# Patient Record
Sex: Female | Born: 1965 | Hispanic: Yes | Marital: Married | Smoking: Never smoker
Health system: Southern US, Community
[De-identification: ages and names within clinical notes are randomized; demographics above are authoritative.]

## PROBLEM LIST (undated history)

## (undated) DIAGNOSIS — K219 Gastro-esophageal reflux disease without esophagitis: Secondary | ICD-10-CM

## (undated) DIAGNOSIS — K2970 Gastritis, unspecified, without bleeding: Secondary | ICD-10-CM

## (undated) DIAGNOSIS — J45909 Unspecified asthma, uncomplicated: Secondary | ICD-10-CM

## (undated) DIAGNOSIS — G43909 Migraine, unspecified, not intractable, without status migrainosus: Secondary | ICD-10-CM

## (undated) DIAGNOSIS — I10 Essential (primary) hypertension: Secondary | ICD-10-CM

---

## 2012-10-25 ENCOUNTER — Encounter (HOSPITAL_COMMUNITY): Payer: Self-pay | Admitting: Emergency Medicine

## 2012-10-25 ENCOUNTER — Emergency Department (HOSPITAL_COMMUNITY): Payer: Self-pay

## 2012-10-25 ENCOUNTER — Emergency Department (HOSPITAL_COMMUNITY)
Admission: EM | Admit: 2012-10-25 | Discharge: 2012-10-25 | Disposition: A | Discharge status: Home / Self Care | Payer: Self-pay | Attending: Emergency Medicine | Admitting: Emergency Medicine

## 2012-10-25 DIAGNOSIS — R6883 Chills (without fever): Secondary | ICD-10-CM | POA: Insufficient documentation

## 2012-10-25 DIAGNOSIS — R51 Headache: Secondary | ICD-10-CM | POA: Insufficient documentation

## 2012-10-25 DIAGNOSIS — IMO0001 Reserved for inherently not codable concepts without codable children: Secondary | ICD-10-CM | POA: Insufficient documentation

## 2012-10-25 DIAGNOSIS — J069 Acute upper respiratory infection, unspecified: Secondary | ICD-10-CM | POA: Insufficient documentation

## 2012-10-25 DIAGNOSIS — J45909 Unspecified asthma, uncomplicated: Secondary | ICD-10-CM

## 2012-10-25 DIAGNOSIS — Z8679 Personal history of other diseases of the circulatory system: Secondary | ICD-10-CM | POA: Insufficient documentation

## 2012-10-25 DIAGNOSIS — J45901 Unspecified asthma with (acute) exacerbation: Secondary | ICD-10-CM | POA: Insufficient documentation

## 2012-10-25 DIAGNOSIS — Z79899 Other long term (current) drug therapy: Secondary | ICD-10-CM | POA: Insufficient documentation

## 2012-10-25 DIAGNOSIS — J3489 Other specified disorders of nose and nasal sinuses: Secondary | ICD-10-CM | POA: Insufficient documentation

## 2012-10-25 DIAGNOSIS — J209 Acute bronchitis, unspecified: Secondary | ICD-10-CM

## 2012-10-25 DIAGNOSIS — R059 Cough, unspecified: Secondary | ICD-10-CM | POA: Insufficient documentation

## 2012-10-25 DIAGNOSIS — R05 Cough: Secondary | ICD-10-CM | POA: Insufficient documentation

## 2012-10-25 DIAGNOSIS — R079 Chest pain, unspecified: Secondary | ICD-10-CM | POA: Insufficient documentation

## 2012-10-25 DIAGNOSIS — G43909 Migraine, unspecified, not intractable, without status migrainosus: Secondary | ICD-10-CM | POA: Insufficient documentation

## 2012-10-25 DIAGNOSIS — I10 Essential (primary) hypertension: Secondary | ICD-10-CM | POA: Insufficient documentation

## 2012-10-25 DIAGNOSIS — R0602 Shortness of breath: Secondary | ICD-10-CM | POA: Insufficient documentation

## 2012-10-25 HISTORY — DX: Migraine, unspecified, not intractable, without status migrainosus: G43.909

## 2012-10-25 HISTORY — DX: Unspecified asthma, uncomplicated: J45.909

## 2012-10-25 HISTORY — DX: Essential (primary) hypertension: I10

## 2012-10-25 MED ORDER — PREDNISONE 20 MG PO TABS
40.0000 mg | ORAL_TABLET | Freq: Once | ORAL | Status: AC
  Administered 2012-10-25: 40 mg via ORAL
  Filled 2012-10-25: qty 2

## 2012-10-25 MED ORDER — PREDNISONE 20 MG PO TABS: 40.0000 mg | ORAL_TABLET | Freq: Every day | ORAL | Status: DC

## 2012-10-25 MED ORDER — IPRATROPIUM BROMIDE 0.02 % IN SOLN
0.5000 mg | Freq: Once | RESPIRATORY_TRACT | Status: AC
  Administered 2012-10-25: 0.5 mg via RESPIRATORY_TRACT
  Filled 2012-10-25: qty 2.5

## 2012-10-25 MED ORDER — HYDROCOD POLST-CHLORPHEN POLST 10-8 MG/5ML PO LQCR: 5.0000 mL | Freq: Two times a day (BID) | ORAL | Status: DC | PRN

## 2012-10-25 MED ORDER — ALBUTEROL SULFATE HFA 108 (90 BASE) MCG/ACT IN AERS
1.0000 | INHALATION_SPRAY | Freq: Four times a day (QID) | RESPIRATORY_TRACT | Status: DC | PRN
  Administered 2012-10-25: 1 via RESPIRATORY_TRACT
  Filled 2012-10-25: qty 6.7

## 2012-10-25 MED ORDER — HYDROCOD POLST-CHLORPHEN POLST 10-8 MG/5ML PO LQCR
5.0000 mL | Freq: Once | ORAL | Status: AC
  Administered 2012-10-25: 5 mL via ORAL
  Filled 2012-10-25: qty 5

## 2012-10-25 MED ORDER — ALBUTEROL SULFATE (5 MG/ML) 0.5% IN NEBU
5.0000 mg | INHALATION_SOLUTION | Freq: Once | RESPIRATORY_TRACT | Status: AC
  Administered 2012-10-25: 5 mg via RESPIRATORY_TRACT
  Filled 2012-10-25: qty 1

## 2012-10-25 NOTE — ED Notes (Signed)
Pt c/o URI sx with productive cough with green sputum and fever and body aches; pt sts pain with cough; pt sts hx of asthma and feels acting up

## 2012-10-25 NOTE — ED Provider Notes (Signed)
History     CSN: [id number]  Arrival date & time 10/25/12  1036   First MD Initiated Contact with Patient 10/25/12 1344      Chief Complaint  Patient presents with  . Asthma  . URI    (Consider location/radiation/quality/duration/timing/severity/associated sxs/prior treatment) HPI Comments: Patient speaks broken [place], however her son speaks fluently [place] and [place]. Patient came here to [place] approximately 5 months ago. She has a childhood history of asthma. She does have an inhaler that she uses very infrequently. She reports no prior hospitalizations or secondary to asthma. She reports that she's had a mild cough over the last 2 weeks but her symptoms of shortness of breath and coughing and now chest pain got much worse since yesterday. She reports the chest pain is worse when she coughs. It is apparently dry and nonproductive. She endorses some chills, unsure of fevers. She denies any nausea vomiting. Denies abdominal pain. She also reports some headaches and sinus pressure although she has no nasal congestion and can breathe through her nose well. She denies sore throat.  She has an inhaler again left over from when she traveled here. She does not have a local PMD. She has been taking some over-the-counter cold remedies without any significant relief.  Patient is a 47 y.o. female presenting with asthma and URI. The history is provided by the patient and a relative. The history is limited by a language barrier. No language interpreter was used.  Asthma Associated symptoms include chest pain, headaches and shortness of breath. Pertinent negatives include no abdominal pain.  URI Presenting symptoms: congestion and cough   Presenting symptoms: no rhinorrhea   Associated symptoms: headaches and myalgias     Past Medical History  Diagnosis Date  . Hypertension   . Asthma   . Migraine     History reviewed. No pertinent past surgical history.  History reviewed. No pertinent  family history.  History  Substance Use Topics  . Smoking status: Never Smoker   . Smokeless tobacco: Not on file  . Alcohol Use: No    OB History   Grav Para Term Preterm Abortions TAB SAB Ect Mult Living                  Review of Systems  Unable to perform ROS: Other  Constitutional: Positive for chills.  HENT: Positive for congestion and sinus pressure. Negative for rhinorrhea.   Respiratory: Positive for cough and shortness of breath.   Cardiovascular: Positive for chest pain.  Gastrointestinal: Negative for nausea, vomiting, abdominal pain and diarrhea.  Musculoskeletal: Positive for myalgias.  Neurological: Positive for headaches.    Allergies  Shellfish allergy  Home Medications   Current Outpatient Rx  Name  Route  Sig  Dispense  Refill  . albuterol (PROVENTIL HFA;VENTOLIN HFA) 108 (90 BASE) MCG/ACT inhaler   Inhalation   Inhale 2 puffs into the lungs every 6 (six) hours as needed for shortness of breath.         Marland Kitchen atenolol (TENORMIN) 25 MG tablet   Oral   Take 25 mg by mouth 2 (two) times daily.         . chlorpheniramine-HYDROcodone (TUSSIONEX PENNKINETIC ER) 10-8 MG/5ML LQCR   Oral   Take 5 mLs by mouth every 12 (twelve) hours as needed.   80 mL   0   . predniSONE (DELTASONE) 20 MG tablet   Oral   Take 2 tablets (40 mg total) by mouth daily.   12  tablet   0     BP 99/68  Pulse 93  Temp(Src) 99.2 F (37.3 C) (Oral)  Resp 13  SpO2 99%  Physical Exam  Nursing note and vitals reviewed. Constitutional: She appears well-developed and well-nourished. No distress.  HENT:  Head: Normocephalic and atraumatic.  Right Ear: External ear normal.  Left Ear: External ear normal.  Nose: No mucosal edema or rhinorrhea. Right sinus exhibits no maxillary sinus tenderness and no frontal sinus tenderness. Left sinus exhibits no maxillary sinus tenderness and no frontal sinus tenderness.  Mouth/Throat: Uvula is midline, oropharynx is clear and moist and  mucous membranes are normal.  Eyes: EOM are normal. Pupils are equal, round, and reactive to light. No scleral icterus.  Neck: Trachea normal and normal range of motion. Neck supple.  Cardiovascular: Normal rate and regular rhythm.   No murmur heard. Pulmonary/Chest: Effort normal. No stridor. She has no wheezes. She exhibits tenderness. She exhibits no crepitus, no edema and no deformity.  Paroxysmal coughing  Abdominal: Soft. She exhibits no distension. There is no tenderness.  Lymphadenopathy:    She has no cervical adenopathy.  Neurological: She is alert.  Skin: Skin is warm and dry. No rash noted. She is not diaphoretic.    ED Course  Procedures (including critical care time)  Labs Reviewed - No data to display Dg Chest 2 View  10/25/2012  *RADIOLOGY REPORT*  Clinical Data: Asthma, shortness of breath.  CHEST - 2 VIEW  Comparison: None  Findings: Mild peribronchial thickening. Heart and mediastinal contours are within normal limits.  Questionable opacity in the lingula suggesting early infiltrate.  No effusion.  No acute bony abnormality.  IMPRESSION:  Mild bronchitic changes.  Questionable early infiltrate in the lingula.   Original Report Authenticated By: Rolm Baptise, M.D.      1. Bronchitis with asthma, acute      ra sat is 96% and I interpret to be normal  EKG at time 13:54, shows sinus rhythm at a rate of 93. Normal axis, normal intervals, no ST or T-wave abnormalities. Interpretation of a normal EKG.  3:37 PM Pt still with some weezing, but breathing more comfortably, cough is improved.  Discussed bronchitis and need for continued treatments, steroids at home.     MDM  Pt with URI symptoms and associated bronchitis.  Would benefit I think from steroids, neb treatments and can prescribe tussionex for stronger cough suppression as well as help with CP which is reproducible and I think associated with coughing.          Saddie Benders. Ghim, MD 10/25/12 1540

## 2014-04-14 ENCOUNTER — Emergency Department (HOSPITAL_COMMUNITY)
Admission: EM | Admit: 2014-04-14 | Discharge: 2014-04-14 | Disposition: A | Discharge status: Home / Self Care | Payer: BC Managed Care – PPO | Attending: Emergency Medicine | Admitting: Emergency Medicine

## 2014-04-14 ENCOUNTER — Encounter (HOSPITAL_COMMUNITY): Payer: Self-pay | Admitting: Emergency Medicine

## 2014-04-14 ENCOUNTER — Emergency Department (HOSPITAL_COMMUNITY): Payer: BC Managed Care – PPO

## 2014-04-14 DIAGNOSIS — G43401 Hemiplegic migraine, not intractable, with status migrainosus: Secondary | ICD-10-CM | POA: Insufficient documentation

## 2014-04-14 DIAGNOSIS — IMO0002 Reserved for concepts with insufficient information to code with codable children: Secondary | ICD-10-CM | POA: Insufficient documentation

## 2014-04-14 DIAGNOSIS — J45909 Unspecified asthma, uncomplicated: Secondary | ICD-10-CM | POA: Diagnosis not present

## 2014-04-14 DIAGNOSIS — I10 Essential (primary) hypertension: Secondary | ICD-10-CM | POA: Insufficient documentation

## 2014-04-14 DIAGNOSIS — Z79899 Other long term (current) drug therapy: Secondary | ICD-10-CM | POA: Insufficient documentation

## 2014-04-14 DIAGNOSIS — R51 Headache: Secondary | ICD-10-CM | POA: Diagnosis present

## 2014-04-14 DIAGNOSIS — Z791 Long term (current) use of non-steroidal anti-inflammatories (NSAID): Secondary | ICD-10-CM | POA: Diagnosis not present

## 2014-04-14 LAB — DIFFERENTIAL
Basophils Absolute: 0 10*3/uL (ref 0.0–0.1)
Basophils Relative: 1 % (ref 0–1)
Eosinophils Absolute: 0.2 10*3/uL (ref 0.0–0.7)
Eosinophils Relative: 3 % (ref 0–5)
Lymphocytes Relative: 42 % (ref 12–46)
Lymphs Abs: 2.6 10*3/uL (ref 0.7–4.0)
Monocytes Absolute: 0.5 10*3/uL (ref 0.1–1.0)
Monocytes Relative: 8 % (ref 3–12)
Neutro Abs: 2.8 10*3/uL (ref 1.7–7.7)
Neutrophils Relative %: 46 % (ref 43–77)

## 2014-04-14 LAB — CBC
HCT: 38.1 % (ref 36.0–46.0)
Hemoglobin: 12.4 g/dL (ref 12.0–15.0)
MCH: 29.5 pg (ref 26.0–34.0)
MCHC: 32.5 g/dL (ref 30.0–36.0)
MCV: 90.7 fL (ref 78.0–100.0)
Platelets: 210 10*3/uL (ref 150–400)
RBC: 4.2 MIL/uL (ref 3.87–5.11)
RDW: 13.2 % (ref 11.5–15.5)
WBC: 6.2 10*3/uL (ref 4.0–10.5)

## 2014-04-14 LAB — COMPREHENSIVE METABOLIC PANEL
ALT: 22 U/L (ref 0–35)
AST: 23 U/L (ref 0–37)
Albumin: 3.5 g/dL (ref 3.5–5.2)
Alkaline Phosphatase: 40 U/L (ref 39–117)
Anion gap: 12 (ref 5–15)
BUN: 13 mg/dL (ref 6–23)
CO2: 27 mEq/L (ref 19–32)
Calcium: 10 mg/dL (ref 8.4–10.5)
Chloride: 99 mEq/L (ref 96–112)
Creatinine, Ser: 0.54 mg/dL (ref 0.50–1.10)
GFR calc Af Amer: >90 mL/min (ref >90)
GFR calc non Af Amer: >90 mL/min (ref >90)
Glucose, Bld: 98 mg/dL (ref 70–99)
Potassium: 4.6 mEq/L (ref 3.7–5.3)
Sodium: 138 mEq/L (ref 137–147)
Total Bilirubin: 0.2 mg/dL — ABNORMAL LOW (ref 0.3–1.2)
Total Protein: 7.4 g/dL (ref 6.0–8.3)

## 2014-04-14 LAB — PROTIME-INR
INR: 0.96 (ref 0.00–1.49)
Prothrombin Time: 12.8 seconds (ref 11.6–15.2)

## 2014-04-14 LAB — I-STAT TROPONIN, ED: Troponin i, poc: 0 ng/mL (ref 0.00–0.08)

## 2014-04-14 LAB — APTT: aPTT: 26 seconds (ref 24–37)

## 2014-04-14 MED ORDER — KETOROLAC TROMETHAMINE 30 MG/ML IJ SOLN
30.0000 mg | Freq: Once | INTRAMUSCULAR | Status: AC
  Administered 2014-04-14: 30 mg via INTRAVENOUS
  Filled 2014-04-14: qty 1

## 2014-04-14 MED ORDER — DIPHENHYDRAMINE HCL 50 MG/ML IJ SOLN
25.0000 mg | Freq: Once | INTRAMUSCULAR | Status: AC
  Administered 2014-04-14: 25 mg via INTRAVENOUS
  Filled 2014-04-14: qty 1

## 2014-04-14 MED ORDER — PROCHLORPERAZINE EDISYLATE 5 MG/ML IJ SOLN
10.0000 mg | Freq: Four times a day (QID) | INTRAMUSCULAR | Status: DC | PRN
  Administered 2014-04-14: 10 mg via INTRAVENOUS
  Filled 2014-04-14: qty 2

## 2014-04-14 NOTE — ED Notes (Signed)
CBG Resulted 89

## 2014-04-14 NOTE — ED Notes (Addendum)
Pt has returned from MRI, stating that she could not finish the exam. This RN spoke with MRI who states that the pt refused the suggestion to call for meds, and did not want to finish the exam, however, enough pictures were taken to r/o stroke.

## 2014-04-14 NOTE — ED Provider Notes (Signed)
CSN: [id number]     Arrival date & time 04/14/14  1740 History   First MD Initiated Contact with Patient 04/14/14 1816     Chief Complaint  Patient presents with  . Headache     (Consider location/radiation/quality/duration/timing/severity/associated sxs/prior Treatment) HPI Comments: Patient presents to ER for evaluation of left-sided facial and arm numbness and weakness. Patient complaining of concomitant headache. Patient had similar symptoms in the last couple of days. Symptoms have been coming and going. She complains of intermittent headache accompanied by left-sided numbness and tingling. Symptoms resolved and then returned. The current episode of left-sided weakness began exactly 2 hours ago.  Patient is a 48 y.o. female presenting with headaches.  Headache Associated symptoms: numbness     Past Medical History  Diagnosis Date  . Hypertension   . Asthma   . Migraine    History reviewed. No pertinent past surgical history. History reviewed. No pertinent family history. History  Substance Use Topics  . Smoking status: Never Smoker   . Smokeless tobacco: Not on file  . Alcohol Use: No   OB History   Grav Para Term Preterm Abortions TAB SAB Ect Mult Living                 Review of Systems  Neurological: Positive for weakness, numbness and headaches.  All other systems reviewed and are negative.     Allergies  Shellfish allergy  Home Medications   Prior to Admission medications   Medication Sig Start Date End Date Taking? Authorizing Provider  albuterol (PROVENTIL HFA;VENTOLIN HFA) 108 (90 BASE) MCG/ACT inhaler Inhale 2 puffs into the lungs every 6 (six) hours as needed for shortness of breath.   Yes Historical Provider, MD  atenolol (TENORMIN) 25 MG tablet Take 25 mg by mouth 2 (two) times daily.   Yes Historical Provider, MD  meloxicam (MOBIC) 15 MG tablet Take 15 mg by mouth daily.   Yes Historical Provider, MD  montelukast (SINGULAIR) 10 MG tablet Take 10  mg by mouth at bedtime.   Yes Historical Provider, MD  predniSONE (DELTASONE) 20 MG tablet Take 2 tablets (40 mg total) by mouth daily. 10/25/12  Yes Gavin Pound. Ghim, MD  SUMAtriptan (IMITREX) 100 MG tablet Take 100 mg by mouth every 2 (two) hours as needed for migraine or headache. May repeat in 2 hours if headache persists or recurs.   Yes Historical Provider, MD   BP 109/71  Pulse 80  Temp(Src) 97.7 F (36.5 C) (Oral)  Resp 17  Ht  (1.575 m)  Wt 196 lb (88.905 kg)  BMI 35.84 kg/m2  SpO2 100%  LMP 03/28/2014 Physical Exam  Constitutional: She is oriented to person, place, and time. She appears well-developed and well-nourished. No distress.  HENT:  Head: Normocephalic and atraumatic.  Right Ear: Hearing normal.  Left Ear: Hearing normal.  Nose: Nose normal.  Mouth/Throat: Oropharynx is clear and moist and mucous membranes are normal.  Eyes: Conjunctivae and EOM are normal. Pupils are equal, round, and reactive to light.  Neck: Normal range of motion. Neck supple.  Cardiovascular: Regular rhythm, S1 normal and S2 normal.  Exam reveals no gallop and no friction rub.   No murmur heard. Pulmonary/Chest: Effort normal and breath sounds normal. No respiratory distress. She exhibits no tenderness.  Abdominal: Soft. Normal appearance and bowel sounds are normal. There is no hepatosplenomegaly. There is no tenderness. There is no rebound, no guarding, no tenderness at McBurney's point and negative Murphy's sign. No hernia.  Musculoskeletal: Normal range of motion.  Neurological: She is alert and oriented to person, place, and time. She has normal strength. No cranial nerve deficit or sensory deficit. Coordination normal. GCS eye subscore is 4. GCS verbal subscore is 5. GCS motor subscore is 6.  Extraocular muscle movement: normal No visual field cut Pupils: equal and reactive both direct and consensual response is normal  Patient reports decreased sensation to light touch, left face  and left arm  Proprioception intact  Grip strength 5/5 symmetric in upper extremities Lower extremity strength 5/5 against gravity No pronator drift      Skin: Skin is warm, dry and intact. No rash noted. No cyanosis.  Psychiatric: She has a normal mood and affect. Her speech is normal and behavior is normal. Thought content normal.    ED Course  Procedures (including critical care time) Labs Review Labs Reviewed  COMPREHENSIVE METABOLIC PANEL - Abnormal; Notable for the following:    Total Bilirubin 0.2 (*)    All other components within normal limits  PROTIME-INR  APTT  CBC  DIFFERENTIAL  CBG MONITORING, ED  I-STAT TROPOININ, ED    Imaging Review Ct Head (brain) Wo Contrast  04/14/2014   CLINICAL DATA:  Headache.  Left-sided numbness.  EXAM: CT HEAD WITHOUT CONTRAST  TECHNIQUE: Contiguous axial images were obtained from the base of the skull through the vertex without intravenous contrast.  COMPARISON:  None.  FINDINGS: No acute intracranial abnormality. Specifically, no hemorrhage, hydrocephalus, mass lesion, acute infarction, or significant intracranial injury. No acute calvarial abnormality.  IMPRESSION: Negative noncontrast head CT.  Critical Value/emergent results were called by telephone at the time of interpretation on 04/14/2014 at 6:45 pm to Dr. Abner Greenspan , who verbally acknowledged these results.   Electronically Signed   By: Charlett Nose M.D.   On: 04/14/2014 18:46   Mr Brain Wo Contrast  04/14/2014   CLINICAL DATA:  Severe occipital headache with dizziness. History of migraines.  EXAM: MRI HEAD WITHOUT CONTRAST  TECHNIQUE: Multiplanar, multiecho pulse sequences of the brain and surrounding structures were obtained without intravenous contrast.  COMPARISON:  Prior CT from earlier the same day.  FINDINGS: Study is limited due to patient's inability to tolerate the entirety the exam.  The CSF containing spaces are within normal limits for patient age. No focal parenchymal  signal abnormality is identified. No mass lesion, midline shift, or extra-axial fluid collection. Ventricles are normal in size without evidence of hydrocephalus.  No diffusion-weighted signal abnormality is identified to suggest acute intracranial infarct. Gray-white matter differentiation is maintained. Normal flow voids are seen within the intracranial vasculature. No intracranial hemorrhage identified.  Cervicomedullary junction is within normal limits. Incidental note made of a partially empty sella. Pituitary stalk is midline. The globes and optic nerves demonstrate a normal appearance with normal signal intensity.  The bone marrow signal intensity is normal. Calvarium is intact. Visualized upper cervical spine is within normal limits.  Scalp soft tissues are unremarkable.  Small retention cyst noted within the right maxillary sinus. Paranasal sinuses are otherwise clear. Minimal T2 hyperintensity noted within the right mastoid air cells.  IMPRESSION: Limited study demonstrating no acute intracranial process.   Electronically Signed   By: Rise Mu M.D.   On: 04/14/2014 22:21     EKG Interpretation   Date/Time:  Friday April 14 2014 18:45:24 EDT Ventricular Rate:  69 PR Interval:  144 QRS Duration: 87 QT Interval:  432 QTC Calculation: 463 R Axis:   64 Text Interpretation:  Sinus rhythm Normal ECG Confirmed by Jeovanny Cuadros  MD,  Dashiell Franchino [phone]) on 04/14/2014 7:00:57 PM      MDM   Final diagnoses:  Hemiplegic migraine with status migrainosus, not intractable    Patient presented to the ER for evaluation of headache with a left facial numbness and left arm numbness tingling, possible weakness. She has had intermittent symptoms over the past several days. She does have a history of migraine headache, hemiplegic migraine was considered, but stroke cannot be ruled out. A code Stroke was initiated and she was evaluated by neurology. Neurology agreed that this was likely hemiplegic  migraine. Patient was administered Toradol, Compazine and Benadryl with resolution of symptoms. Plan was if the MRI did not show any acute stroke she can be discharged. MRI, although not the best study, did not show any abnormalities and she is back to her baseline, will be discharged to follow up as an outpatient with her neurologist.    Gilda Crease, MD 04/14/14 [phone]

## 2014-04-14 NOTE — Discharge Instructions (Signed)
Cefalea migrañosa °(Migraine Headache) °Una cefalea migrañosa es un dolor muy intenso y punzante en uno o ambos lados de la cabeza. Hable con su médico sobre los factores que pueden causar (desencadenar) las cefaleas migrañosas. °CUIDADOS EN EL HOGAR °· Tome solo los medicamentos según le haya indicado el médico. °· Cuando tenga la migraña, acuéstese en un cuarto oscuro y tranquilo °· Lleve un registro diario para averiguar si hay ciertas cosas que le provocan la cefalea migrañosa. Por ejemplo, escriba: °¨ Lo que usted come y bebe. °¨ Cuánto tiempo duerme. °¨ Algún cambio en su dieta o en los medicamentos. °· Beba menos alcohol. °· Si fuma, deje de hacerlo. °· Duerma lo suficiente. °· Disminuya todo tipo de estrés de la vida diaria. °· Mantenga las luces tenues si le molestan las luces brillantes o hacen que la migraña empeore. °SOLICITE AYUDA DE INMEDIATO SI:  °· La migraña empeora. °· Tiene fiebre. °· Presenta rigidez en el cuello. °· Tiene dificultad para ver. °· Sus músculos están débiles, o pierde el control muscular. °· Pierde el equilibrio o tiene problemas para caminar. °· Siente que se desvanece (debilidad) o se desmaya. °· Tiene malos síntomas que son diferentes a los primeros síntomas. °ASEGÚRESE DE QUE:  °· Comprende estas instrucciones. °· Controlará su afección. °· Recibirá ayuda de inmediato si no mejora o si empeora. °Document Released: 10/31/2008 Document Revised: 08/09/2013 °ExitCare® Patient Information ©2015 [employer]. This information is not intended to replace advice given to you by your health care provider. Make sure you discuss any questions you have with your health care provider. ° °

## 2014-04-14 NOTE — ED Notes (Signed)
Transport has arrived to take the pt to MRI, but the pt has not spoken with the doctor. This RN explained to the pt that the MRI today is to make sure that she did not have a stroke, and to make sure that her brain and the vessels in it are normal. Pt consented to MRI.

## 2014-04-14 NOTE — ED Notes (Signed)
Pt called out stating that she has an MRI scheduled in 2 weeks, and is wondering if she needs the MRI today. Pollina, MD is aware and will see the pt.

## 2014-04-14 NOTE — ED Notes (Signed)
Presetns with 2 days of headache that felt like a migraine-pain came and went-yesterday began having left sided arm numbness and headache that came and went as well-today at 4 pm began experiencing severe headache with nausea, dizziness and left sided arm numbness. Speech clear, no drift, arm grip equal bilaterally. Pt reports facial heaviness, left side slightly drooped, symmetrical with smile. Follows commands. Alert, oriented. Headache rated 10/10

## 2014-04-14 NOTE — ED Notes (Signed)
Activated Code Stroke 

## 2014-04-14 NOTE — Code Documentation (Signed)
Code stroke called at 1814, patient arrived to [facility] Ed via private vehicle.  LSN 04/12/14, As per patient has had a migraine since Wednesday and experiencing headache blurred vision and numbness of left side, symptoms never gone away completely just subside.  NIHSS 0.  Cancelled  1857

## 2014-04-14 NOTE — Consult Note (Signed)
Referring Physician: Pollina    Chief Complaint: Headache and left sided numbness  HPI: Loretta Carroll is an 48 y.o. female with a history of migraines that reports that on Wednesday she began to have headache that was associated with nausea and vomiting. She also developed left sided numbness and dizziness. Reports blurred vision and a heaviness on the left side of her face as well.  Her symptoms have waxed and waned since their initiation on Wednesday but have not completely resolved.  Reports that today her headache achieved a level of 10/10 and her accompanying symptoms worsened.  Patient presented for evaluation at that time.  Code stroke was called.  Initial NIHSS of 0   Date last known well: Date: 04/12/2014 Time last known well: Unable to determine tPA Given: No: Outside time window  Past Medical History  Diagnosis Date  . Hypertension   . Asthma   . Migraine     History reviewed. No pertinent past surgical history.  Family history: Son alive and well  Social History:  reports that she has never smoked. She does not have any smokeless tobacco history on file. She reports that she does not drink alcohol or use illicit drugs.  Allergies:  Allergies  Allergen Reactions  . Shellfish Allergy Anaphylaxis    Medications: I have reviewed the patient's current medications. Prior to Admission:  Current outpatient prescriptions:albuterol (PROVENTIL HFA;VENTOLIN HFA) 108 (90 BASE) MCG/ACT inhaler, Inhale 2 puffs into the lungs every 6 (six) hours as needed for shortness of breath., Disp: , Rfl: ;  atenolol (TENORMIN) 25 MG tablet, Take 25 mg by mouth 2 (two) times daily., Disp: , Rfl: ;  meloxicam (MOBIC) 15 MG tablet, Take 15 mg by mouth daily., Disp: , Rfl:  montelukast (SINGULAIR) 10 MG tablet, Take 10 mg by mouth at bedtime., Disp: , Rfl: ;  predniSONE (DELTASONE) 20 MG tablet, Take 2 tablets (40 mg total) by mouth daily., Disp: 12 tablet, Rfl: 0;  SUMAtriptan (IMITREX) 100 MG tablet,  Take 100 mg by mouth every 2 (two) hours as needed for migraine or headache. May repeat in 2 hours if headache persists or recurs., Disp: , Rfl:   ROS: History obtained from the patient  General ROS: negative for - chills, fatigue, fever, night sweats, weight gain or weight loss Psychological ROS: negative for - behavioral disorder, hallucinations, memory difficulties, mood swings or suicidal ideation Ophthalmic ROS: as noted in HPI ENT ROS: negative for - epistaxis, nasal discharge, oral lesions, sore throat, tinnitus or vertigo Allergy and Immunology ROS: negative for - hives or itchy/watery eyes Hematological and Lymphatic ROS: negative for - bleeding problems, bruising or swollen lymph nodes Endocrine ROS: negative for - galactorrhea, hair pattern changes, polydipsia/polyuria or temperature intolerance Respiratory ROS: negative for - cough, hemoptysis, shortness of breath or wheezing Cardiovascular ROS: negative for - chest pain, dyspnea on exertion, edema or irregular heartbeat Gastrointestinal ROS: as noted in HPI Genito-Urinary ROS: negative for - dysuria, hematuria, incontinence or urinary frequency/urgency Musculoskeletal ROS: negative for - joint swelling or muscular weakness Neurological ROS: as noted in HPI Dermatological ROS: negative for rash and skin lesion changes  Physical Examination: Blood pressure 105/67, pulse 59, temperature 97.9 F (36.6 C), temperature source Oral, resp. rate 12, height 5' 2"  (1.575 m), weight 88.905 kg (196 lb), last menstrual period 03/28/2014, SpO2 100.00%.  Neurologic Examination: Mental Status: Alert, oriented, thought content appropriate.  Speech fluent without evidence of aphasia.  Able to follow 3 step commands without difficulty. Cranial Nerves:  II: Discs flat bilaterally; Visual fields grossly normal, pupils equal, round, reactive to light and accommodation III,IV, VI: ptosis not present, extra-ocular motions intact bilaterally V,VII:  mild left facial asymmetry, facial light touch sensation normal bilaterally VIII: hearing normal bilaterally IX,X: gag reflex present XI: bilateral shoulder shrug XII: midline tongue extension Motor: Right : Upper extremity   5/5    Left:     Upper extremity   5/5  Lower extremity   5/5     Lower extremity   5/5 Tone and bulk:normal tone throughout; no atrophy noted Sensory: Pinprick and light touch intact throughout, bilaterally Deep Tendon Reflexes: 2+ and symmetric throughout Plantars: Right: downgoing   Left: downgoing Cerebellar: normal finger-to-nose and normal heel-to-shin test Gait: Unable to test due to pain CV: pulses palpable throughout     Laboratory Studies:  Basic Metabolic Panel:  Recent Labs Lab 04/14/14 1820  NA 138  K 4.6  CL 99  CO2 27  GLUCOSE 98  BUN 13  CREATININE 0.54  CALCIUM 10.0    Liver Function Tests:  Recent Labs Lab 04/14/14 1820  AST 23  ALT 22  ALKPHOS 40  BILITOT 0.2*  PROT 7.4  ALBUMIN 3.5   No results found for this basename: LIPASE, AMYLASE,  in the last 168 hours No results found for this basename: AMMONIA,  in the last 168 hours  CBC:  Recent Labs Lab 04/14/14 1820  WBC 6.2  NEUTROABS 2.8  HGB 12.4  HCT 38.1  MCV 90.7  PLT 210    Cardiac Enzymes: No results found for this basename: CKTOTAL, CKMB, CKMBINDEX, TROPONINI,  in the last 168 hours  BNP: No components found with this basename: POCBNP,   CBG: No results found for this basename: GLUCAP,  in the last 168 hours  Microbiology: No results found for this or any previous visit.  Coagulation Studies:  Recent Labs  04/14/14 1820  LABPROT 12.8  INR 0.96    Urinalysis: No results found for this basename: COLORURINE, APPERANCEUR, LABSPEC, PHURINE, GLUCOSEU, HGBUR, BILIRUBINUR, KETONESUR, PROTEINUR, UROBILINOGEN, NITRITE, LEUKOCYTESUR,  in the last 168 hours  Lipid Panel: No results found for this basename: chol, trig, hdl, cholhdl, vldl, ldlcalc     HgbA1C:  No results found for this basename: HGBA1C    Urine Drug Screen:   No results found for this basename: labopia, cocainscrnur, labbenz, amphetmu, thcu, labbarb    Alcohol Level: No results found for this basename: ETH,  in the last 168 hours  Other results: EKG: normal sinus rhythm at 69 bpm.  Imaging: Ct Head (brain) Wo Contrast  04/14/2014   CLINICAL DATA:  Headache.  Left-sided numbness.  EXAM: CT HEAD WITHOUT CONTRAST  TECHNIQUE: Contiguous axial images were obtained from the base of the skull through the vertex without intravenous contrast.  COMPARISON:  None.  FINDINGS: No acute intracranial abnormality. Specifically, no hemorrhage, hydrocephalus, mass lesion, acute infarction, or significant intracranial injury. No acute calvarial abnormality.  IMPRESSION: Negative noncontrast head CT.  Critical Value/emergent results were called by telephone at the time of interpretation on 04/14/2014 at 6:45 pm to Dr. Kirkland Hun , who verbally acknowledged these results.   Electronically Signed   By: Rolm Baptise M.D.   On: 04/14/2014 18:46    Assessment: 48 y.o. female presenting with headache and fluctuating left sided complaints of numbness with blurred vision.  Patient outside time window for tPA.  Head CT reviewed and shows no acute changes.  Symptoms likely represent a complicated migraine.  Due to persistence of symptoms though will rule out a subacute ischemic event.    Stroke Risk Factors - hypertension  Plan: 1. Migraine cocktail 2. MRI of the brain without contrast.  If no evidence of an acute event would not pursue further work up.    Case discussed with Dr. Charlann Noss, MD [facility] [phone] 04/14/2014, 7:17 PM

## 2014-04-17 LAB — CBG MONITORING, ED: GLUCOSE-CAPILLARY: 89 mg/dL (ref 70–99)

## 2016-01-06 ENCOUNTER — Emergency Department (HOSPITAL_COMMUNITY)
Admission: EM | Admit: 2016-01-06 | Discharge: 2016-01-06 | Disposition: A | Discharge status: Home / Self Care | Payer: BLUE CROSS/BLUE SHIELD | Attending: Emergency Medicine | Admitting: Emergency Medicine

## 2016-01-06 ENCOUNTER — Emergency Department (HOSPITAL_COMMUNITY): Payer: BLUE CROSS/BLUE SHIELD

## 2016-01-06 ENCOUNTER — Encounter (HOSPITAL_COMMUNITY): Payer: Self-pay

## 2016-01-06 DIAGNOSIS — Z791 Long term (current) use of non-steroidal anti-inflammatories (NSAID): Secondary | ICD-10-CM | POA: Insufficient documentation

## 2016-01-06 DIAGNOSIS — Z79899 Other long term (current) drug therapy: Secondary | ICD-10-CM | POA: Diagnosis not present

## 2016-01-06 DIAGNOSIS — I10 Essential (primary) hypertension: Secondary | ICD-10-CM | POA: Insufficient documentation

## 2016-01-06 DIAGNOSIS — R42 Dizziness and giddiness: Secondary | ICD-10-CM | POA: Diagnosis not present

## 2016-01-06 DIAGNOSIS — R1084 Generalized abdominal pain: Secondary | ICD-10-CM | POA: Insufficient documentation

## 2016-01-06 DIAGNOSIS — Z7952 Long term (current) use of systemic steroids: Secondary | ICD-10-CM | POA: Insufficient documentation

## 2016-01-06 DIAGNOSIS — R112 Nausea with vomiting, unspecified: Secondary | ICD-10-CM | POA: Diagnosis not present

## 2016-01-06 DIAGNOSIS — R200 Anesthesia of skin: Secondary | ICD-10-CM | POA: Diagnosis not present

## 2016-01-06 DIAGNOSIS — R51 Headache: Secondary | ICD-10-CM | POA: Insufficient documentation

## 2016-01-06 DIAGNOSIS — J45909 Unspecified asthma, uncomplicated: Secondary | ICD-10-CM | POA: Insufficient documentation

## 2016-01-06 DIAGNOSIS — R531 Weakness: Secondary | ICD-10-CM | POA: Diagnosis not present

## 2016-01-06 DIAGNOSIS — Z3202 Encounter for pregnancy test, result negative: Secondary | ICD-10-CM | POA: Diagnosis not present

## 2016-01-06 DIAGNOSIS — R1010 Upper abdominal pain, unspecified: Secondary | ICD-10-CM | POA: Diagnosis present

## 2016-01-06 LAB — COMPREHENSIVE METABOLIC PANEL
ALK PHOS: 45 U/L (ref 38–126)
ALT: 22 U/L (ref 14–54)
ANION GAP: 12 (ref 5–15)
AST: 25 U/L (ref 15–41)
Albumin: 3.5 g/dL (ref 3.5–5.0)
BILIRUBIN TOTAL: 0.3 mg/dL (ref 0.3–1.2)
BUN: 14 mg/dL (ref 6–20)
CALCIUM: 9.2 mg/dL (ref 8.9–10.3)
CO2: 20 mmol/L — ABNORMAL LOW (ref 22–32)
Chloride: 101 mmol/L (ref 101–111)
Creatinine, Ser: 0.57 mg/dL (ref 0.44–1.00)
GLUCOSE: 114 mg/dL — AB (ref 65–99)
POTASSIUM: 3.9 mmol/L (ref 3.5–5.1)
Sodium: 133 mmol/L — ABNORMAL LOW (ref 135–145)
TOTAL PROTEIN: 7.1 g/dL (ref 6.5–8.1)

## 2016-01-06 LAB — URINALYSIS, ROUTINE W REFLEX MICROSCOPIC
Glucose, UA: NEGATIVE mg/dL
Hgb urine dipstick: NEGATIVE
Ketones, ur: 15 mg/dL — AB
LEUKOCYTES UA: NEGATIVE
Nitrite: NEGATIVE
Protein, ur: NEGATIVE mg/dL
SPECIFIC GRAVITY, URINE: 1.036 — AB (ref 1.005–1.030)
pH: 5.5 (ref 5.0–8.0)

## 2016-01-06 LAB — CBC
HEMATOCRIT: 41.4 % (ref 36.0–46.0)
Hemoglobin: 13.2 g/dL (ref 12.0–15.0)
MCH: 27.7 pg (ref 26.0–34.0)
MCHC: 31.9 g/dL (ref 30.0–36.0)
MCV: 87 fL (ref 78.0–100.0)
Platelets: 172 10*3/uL (ref 150–400)
RBC: 4.76 MIL/uL (ref 3.87–5.11)
RDW: 13.1 % (ref 11.5–15.5)
WBC: 11.4 10*3/uL — ABNORMAL HIGH (ref 4.0–10.5)

## 2016-01-06 LAB — LIPASE, BLOOD: Lipase: 25 U/L (ref 11–51)

## 2016-01-06 LAB — HCG, QUANTITATIVE, PREGNANCY

## 2016-01-06 MED ORDER — DICYCLOMINE HCL 20 MG PO TABS: 20.0000 mg | ORAL_TABLET | Freq: Two times a day (BID) | ORAL | Status: DC

## 2016-01-06 MED ORDER — SODIUM CHLORIDE 0.9 % IV BOLUS (SEPSIS)
1000.0000 mL | Freq: Once | INTRAVENOUS | Status: AC
  Administered 2016-01-06: 1000 mL via INTRAVENOUS

## 2016-01-06 MED ORDER — GI COCKTAIL ~~LOC~~
30.0000 mL | Freq: Once | ORAL | Status: AC
  Administered 2016-01-06: 30 mL via ORAL
  Filled 2016-01-06: qty 30

## 2016-01-06 MED ORDER — ONDANSETRON HCL 4 MG/2ML IJ SOLN
4.0000 mg | Freq: Once | INTRAMUSCULAR | Status: AC
  Administered 2016-01-06: 4 mg via INTRAVENOUS
  Filled 2016-01-06: qty 2

## 2016-01-06 NOTE — Discharge Instructions (Signed)
As discussed, your evaluation today has been largely reassuring.  But, it is important that you monitor your condition carefully, and do not hesitate to return to the ED if you develop new, or concerning changes in your condition.  Otherwise, please follow-up with your physician for appropriate ongoing care.   Dolor abdominal en adultos (Abdominal Pain, Adult) El dolor puede tener muchas causas. Normalmente la causa del dolor abdominal no es una enfermedad y Teacher, English as a foreign language sin Clinical research associate. Frecuentemente puede controlarse y tratarse en casa. Su mdico le Chartered certified accountant examen fsico y posiblemente solicite anlisis de sangre y radiografas para ayudar a Teacher, adult education la gravedad de su dolor. Sin embargo, en [employer], debe transcurrir ms tiempo antes de que se pueda Pension scheme manager una causa evidente del dolor. Antes de llegar a ese punto, es posible que su mdico no sepa si necesita ms pruebas o un tratamiento ms profundo. INSTRUCCIONES PARA EL CUIDADO EN EL HOGAR  Est atento al dolor para ver si hay cambios. Las siguientes indicaciones ayudarn a Chief Strategy Officer que pueda sentir:  [place] solo medicamentos de venta libre o recetados, segn las indicaciones del mdico.  No tome laxantes a menos que se lo haya indicado su mdico.  Pruebe con Ardelia Mems dieta lquida absoluta (caldo, t o agua) segn se lo indique su mdico. Introduzca gradualmente una dieta normal, segn su tolerancia. SOLICITE ATENCIN MDICA SI:  Tiene dolor abdominal sin explicacin.  Tiene dolor abdominal relacionado con nuseas o diarrea.  Tiene dolor cuando orina o defeca.  Experimenta dolor abdominal que lo despierta de noche.  Tiene dolor abdominal que empeora o mejora cuando come alimentos.  Tiene dolor abdominal que empeora cuando come alimentos grasosos.  Tiene fiebre. SOLICITE ATENCIN MDICA DE INMEDIATO SI:   El dolor no desaparece en un plazo mximo de 2horas.  No deja de (vomitar).  El Social research officer, government se siente  solo en partes del abdomen, como el lado derecho o la parte inferior izquierda del abdomen.  Evaca materia fecal sanguinolenta o negra, de aspecto alquitranado. ASEGRESE DE QUE:  Comprende estas instrucciones.  Controlar su afeccin.  Recibir ayuda de inmediato si no mejora o si empeora.   Esta informacin no tiene Marine scientist el consejo del mdico. Asegrese de hacerle al mdico cualquier pregunta que tenga.   Document Released: 08/04/2005 Document Revised: 08/25/2014 Elsevier Interactive Patient Education [employer].

## 2016-01-06 NOTE — ED Notes (Signed)
Attempted to draw pts labs was unsuccessful. Asher MuirJamie, RN notified

## 2016-01-06 NOTE — ED Notes (Signed)
Patient here with acute dizziness that started at 0800 and feels as if her mouth is heavy. Slight mouth droop noted, no extremity weakness. Reports that these symptoms started after she developed left sided abdominal pain and cramping and had taken dulcolax last pm for constipation. Had 1 episode of vomiting

## 2016-01-06 NOTE — ED Notes (Signed)
[place] up to evaluate patient, no code stroke activated

## 2016-01-06 NOTE — ED Provider Notes (Signed)
CSN: [id number]     Arrival date & time 01/06/16  1219 History   First MD Initiated Contact with Patient 01/06/16 1259     Chief Complaint  Patient presents with  . Dizziness  . facial numbness   . Abdominal Pain    HPI  Patient was also concern of dizziness, nauseousness and upper abdominal discomfort. Symptoms began earlier today about 8 hours ago. Initially there was abdominal pain, subsequently patient developed her other symptoms. Patient has a history of migraines, ongoing gastroenterologic issues, for which she has seen physicians, had endoscopy, and is currently taking multiple medications. Patient has one recent sick contact. Symptoms symptoms began earlier today, she has had persistent mild nausea, dizziness, but no loss of sensation, strength, sight. She had a small amount of vomiting earlier today, no diarrhea. Patient has taken stool softener recently due to constipation.   Past Medical History  Diagnosis Date  . Hypertension   . Asthma   . Migraine    History reviewed. No pertinent past surgical history. No family history on file. Social History  Substance Use Topics  . Smoking status: Never Smoker   . Smokeless tobacco: None  . Alcohol Use: No   OB History    No data available     Review of Systems  Constitutional:       Per HPI, otherwise negative  HENT:       Per HPI, otherwise negative  Respiratory:       Per HPI, otherwise negative  Cardiovascular:       Per HPI, otherwise negative  Gastrointestinal: Positive for nausea and vomiting.  Endocrine:       Negative aside from HPI  Genitourinary:       Neg aside from HPI   Musculoskeletal:       Per HPI, otherwise negative  Skin: Negative.   Neurological: Positive for dizziness, weakness, numbness and headaches. Negative for syncope.      Allergies  Shellfish allergy  Home Medications   Prior to Admission medications   Medication Sig Start Date End Date Taking? Authorizing Provider   albuterol (PROVENTIL HFA;VENTOLIN HFA) 108 (90 BASE) MCG/ACT inhaler Inhale 2 puffs into the lungs every 6 (six) hours as needed for shortness of breath.    Historical Provider, MD  atenolol (TENORMIN) 25 MG tablet Take 25 mg by mouth 2 (two) times daily.    Historical Provider, MD  meloxicam (MOBIC) 15 MG tablet Take 15 mg by mouth daily.    Historical Provider, MD  montelukast (SINGULAIR) 10 MG tablet Take 10 mg by mouth at bedtime.    Historical Provider, MD  predniSONE (DELTASONE) 20 MG tablet Take 2 tablets (40 mg total) by mouth daily. 10/25/12   Kingsley Spittle, MD  SUMAtriptan (IMITREX) 100 MG tablet Take 100 mg by mouth every 2 (two) hours as needed for migraine or headache. May repeat in 2 hours if headache persists or recurs.    Historical Provider, MD   BP 134/96 mmHg  Pulse 83  Temp(Src) 98.1 F (36.7 C)  Resp 18  SpO2 100% Physical Exam  Constitutional: She is oriented to person, place, and time. She appears well-developed and well-nourished. No distress.  HENT:  Head: Normocephalic and atraumatic.  Eyes: Conjunctivae and EOM are normal.  Cardiovascular: Normal rate and regular rhythm.   Pulmonary/Chest: Effort normal and breath sounds normal. No stridor. No respiratory distress.  Abdominal: She exhibits no distension.  Mild tenderness about the epigastrium  Musculoskeletal: She exhibits no  edema.  Neurological: She is alert and oriented to person, place, and time. She displays no atrophy and no tremor. No cranial nerve deficit. She exhibits normal muscle tone. She displays no seizure activity. Coordination normal.  Skin: Skin is warm and dry.  Psychiatric: She has a normal mood and affect.  Nursing note and vitals reviewed.   ED Course  Procedures (including critical care time) Labs Review Labs Reviewed  COMPREHENSIVE METABOLIC PANEL - Abnormal; Notable for the following:    Sodium 133 (*)    CO2 20 (*)    Glucose, Bld 114 (*)    All other components within normal  limits  CBC - Abnormal; Notable for the following:    WBC 11.4 (*)    All other components within normal limits  URINALYSIS, ROUTINE W REFLEX MICROSCOPIC (NOT AT [facility]) - Abnormal; Notable for the following:    Color, Urine AMBER (*)    Specific Gravity, Urine 1.036 (*)    Bilirubin Urine SMALL (*)    Ketones, ur 15 (*)    All other components within normal limits  LIPASE, BLOOD  HCG, QUANTITATIVE, PREGNANCY  I-STAT BETA HCG BLOOD, ED ([facility], [facility], [facility] ONLY)    Imaging Review Dg Abd Acute W/chest  01/06/2016  CLINICAL DATA:  Epigastric pain. EXAM: DG ABDOMEN ACUTE W/ 1V CHEST COMPARISON:  October 25, 2012 FINDINGS: There is no evidence of dilated bowel loops or free intraperitoneal air. No radiopaque calculi or other significant radiographic abnormality is seen. Heart size and mediastinal contours are within normal limits. Both lungs are clear. IMPRESSION: Negative abdominal radiographs. No acute cardiopulmonary disease. Calcifications in the left pelvis are probably phleboliths. Electronically Signed   By: Dorise Bullion III M.D   On: 01/06/2016 15:16   I have personally reviewed and evaluated these images and lab results as part of my medical decision-making.   EKG Interpretation   Date/Time:  Sunday Jan 06 2016 12:27:51 EDT Ventricular Rate:  75 PR Interval:  140 QRS Duration: 74 QT Interval:  384 QTC Calculation: 428 R Axis:   62 Text Interpretation:  Normal sinus rhythm with sinus arrhythmia No  significant change since last tracing Borderline ECG Confirmed by  Carmin Muskrat  MD [phone]) on 01/06/2016 1:04:38 PM     Chart review notable for evaluation recently for numbness, patient was evaluated as a code stroke, with reassuring findings.  4:01 PM Patient states that she feels better. She has had one bowel movement, no diarrhea. Slight blood. Patient has a history of hemorrhoids. Patient states that she has GI with whom she'll follow-up in the coming days.  MDM  Patient  history of migraines now presents with abdominal pain. Patient does describe some nausea, dizziness, but there are no focal neurologic deficits, and with the patient's history of migraines, status migrainosus, there is some suspicion for atypical features of migraine contributing to her overall presentation. With no focal findings, no indication for head CT. Patient's evaluation otherwise reassuring, no evidence for acute new abdominal processes, and with improvement here with antiemetics, fluids, patient will follow-up as an outpatient with her gastroenterologist.  Carmin Muskrat, MD 01/06/16 [phone]

## 2016-01-06 NOTE — ED Notes (Signed)
Patient transported to X-ray 

## 2016-05-07 ENCOUNTER — Ambulatory Visit: Payer: Self-pay | Admitting: Gynecology

## 2016-05-22 ENCOUNTER — Ambulatory Visit: Payer: Self-pay | Admitting: Gynecology

## 2016-10-01 ENCOUNTER — Emergency Department (HOSPITAL_COMMUNITY): Payer: BLUE CROSS/BLUE SHIELD

## 2016-10-01 ENCOUNTER — Encounter (HOSPITAL_COMMUNITY): Payer: Self-pay

## 2016-10-01 ENCOUNTER — Emergency Department (HOSPITAL_COMMUNITY)
Admission: EM | Admit: 2016-10-01 | Discharge: 2016-10-02 | Disposition: A | Discharge status: Home / Self Care | Payer: BLUE CROSS/BLUE SHIELD | Attending: Emergency Medicine | Admitting: Emergency Medicine

## 2016-10-01 DIAGNOSIS — I10 Essential (primary) hypertension: Secondary | ICD-10-CM | POA: Insufficient documentation

## 2016-10-01 DIAGNOSIS — J45909 Unspecified asthma, uncomplicated: Secondary | ICD-10-CM | POA: Diagnosis not present

## 2016-10-01 DIAGNOSIS — Z79899 Other long term (current) drug therapy: Secondary | ICD-10-CM | POA: Insufficient documentation

## 2016-10-01 DIAGNOSIS — R109 Unspecified abdominal pain: Secondary | ICD-10-CM

## 2016-10-01 DIAGNOSIS — R1084 Generalized abdominal pain: Secondary | ICD-10-CM | POA: Diagnosis present

## 2016-10-01 LAB — COMPREHENSIVE METABOLIC PANEL
ALK PHOS: 43 U/L (ref 38–126)
ALT: 42 U/L (ref 14–54)
AST: 37 U/L (ref 15–41)
Albumin: 3.8 g/dL (ref 3.5–5.0)
Anion gap: 11 (ref 5–15)
BILIRUBIN TOTAL: 0.4 mg/dL (ref 0.3–1.2)
BUN: 11 mg/dL (ref 6–20)
CALCIUM: 9.1 mg/dL (ref 8.9–10.3)
CO2: 23 mmol/L (ref 22–32)
Chloride: 106 mmol/L (ref 101–111)
Creatinine, Ser: 0.62 mg/dL (ref 0.44–1.00)
GFR calc Af Amer: >60 mL/min (ref >60)
Glucose, Bld: 102 mg/dL — ABNORMAL HIGH (ref 65–99)
Potassium: 4.1 mmol/L (ref 3.5–5.1)
Sodium: 140 mmol/L (ref 135–145)
Total Protein: 7.4 g/dL (ref 6.5–8.1)

## 2016-10-01 LAB — URINALYSIS, ROUTINE W REFLEX MICROSCOPIC
BILIRUBIN URINE: NEGATIVE
Glucose, UA: NEGATIVE mg/dL
KETONES UR: NEGATIVE mg/dL
LEUKOCYTES UA: NEGATIVE
Nitrite: NEGATIVE
PH: 5.5 (ref 5.0–8.0)
Protein, ur: NEGATIVE mg/dL

## 2016-10-01 LAB — CBC
HCT: 38.1 % (ref 36.0–46.0)
Hemoglobin: 12.3 g/dL (ref 12.0–15.0)
MCH: 27.4 pg (ref 26.0–34.0)
MCHC: 32.3 g/dL (ref 30.0–36.0)
MCV: 84.9 fL (ref 78.0–100.0)
Platelets: 198 10*3/uL (ref 150–400)
RBC: 4.49 MIL/uL (ref 3.87–5.11)
RDW: 13.9 % (ref 11.5–15.5)
WBC: 4.6 10*3/uL (ref 4.0–10.5)

## 2016-10-01 LAB — URINALYSIS, MICROSCOPIC (REFLEX)

## 2016-10-01 LAB — LIPASE, BLOOD: Lipase: 26 U/L (ref 11–51)

## 2016-10-01 LAB — PREGNANCY, URINE: Preg Test, Ur: NEGATIVE

## 2016-10-01 MED ORDER — IOPAMIDOL (ISOVUE-300) INJECTION 61%
INTRAVENOUS | Status: AC
  Administered 2016-10-01: 100 mL
  Filled 2016-10-01: qty 100

## 2016-10-01 MED ORDER — METOCLOPRAMIDE HCL 5 MG/ML IJ SOLN
10.0000 mg | Freq: Once | INTRAMUSCULAR | Status: DC
  Filled 2016-10-01: qty 2

## 2016-10-01 NOTE — ED Provider Notes (Signed)
[facility]-EMERGENCY DEPT Provider Note   CSN: [id number] Arrival date & time: 10/01/16  2000     History   Chief Complaint Chief Complaint  Patient presents with  . Abdominal Pain    HPI Loretta Carroll is a 51 y.o. female.  HPI Presents with concerns of diffuse abdominal pain. Patient states the symptoms started approximately 1 week ago and she's currently being evaluated for possible IBS. She states she is very concerned that there is "something inside me" states that her abdomen is getting distended and that she is not having normal bowel movements. She is having at least 4 bowel movements of clear fluids daily. She feels like she cannot get a real bowel movement out. She also vomiting profusely. No recent sick contacts. Did have some rectal bleeding but has a hemorrhoid and this has subsided. There was never any black stool, no coffee-ground emesis or hematemesis. Patient states that her primary care provider thought she was constipated and put her on multiple laxatives. This is only worsened her diarrhea. Not on any antibiotics.  Past Medical History:  Diagnosis Date  . Asthma   . Hypertension   . Migraine     Patient Active Problem List   Diagnosis Date Noted  . Migraine     History reviewed. No pertinent surgical history.  OB History    No data available       Home Medications    Prior to Admission medications   Medication Sig Start Date End Date Taking? Authorizing Provider  albuterol (PROVENTIL HFA;VENTOLIN HFA) 108 (90 BASE) MCG/ACT inhaler Inhale 2 puffs into the lungs every 6 (six) hours as needed for shortness of breath.   Yes Historical Provider, MD  albuterol (PROVENTIL) (2.5 MG/3ML) 0.083% nebulizer solution Take 2.5 mg by nebulization every 6 (six) hours as needed for wheezing or shortness of breath.   Yes Historical Provider, MD  Bisacodyl (DULCOLAX PO) Take 2 tablets by mouth daily as needed (constiation).    Yes Historical Provider, MD  budesonide (PULMICORT)  180 MCG/ACT inhaler Inhale 1 puff into the lungs every morning.   Yes Historical Provider, MD  linaclotide (LINZESS) 145 MCG CAPS capsule Take 145 mcg by mouth daily before breakfast.   Yes Historical Provider, MD  montelukast (SINGULAIR) 10 MG tablet Take 10 mg by mouth at bedtime.   Yes Historical Provider, MD  omeprazole (PRILOSEC) 40 MG capsule Take 40 mg by mouth daily.   Yes Historical Provider, MD  ranitidine (ZANTAC) 150 MG tablet Take 150 mg by mouth 2 (two) times daily.   Yes Historical Provider, MD  simethicone (MYLICON) 80 MG chewable tablet Chew 80 mg by mouth every 6 (six) hours as needed for flatulence.   Yes Historical Provider, MD  SUMAtriptan (IMITREX) 100 MG tablet Take 100 mg by mouth every 2 (two) hours as needed for migraine or headache. May repeat in 2 hours if headache persists or recurs.   Yes Historical Provider, MD  metoCLOPramide (REGLAN) 10 MG tablet Take 1 tablet (10 mg total) by mouth every 8 (eight) hours as needed for nausea, vomiting or refractory nausea / vomiting. 10/02/16   Sidney AceAlison Charruf Taryn Shellhammer, MD    Family History History reviewed. No pertinent family history.  Social History Social History  Substance Use Topics  . Smoking status: Never Smoker  . Smokeless tobacco: Never Used  . Alcohol use No     Allergies   Shellfish allergy   Review of Systems Review of Systems  Constitutional: Negative for fever.  Allergic/Immunologic: Negative for immunocompromised state.  All other systems reviewed and are negative.    Physical Exam Updated Vital Signs BP 141/90   Pulse 108   Temp 98.6 F (37 C) (Oral)   Resp 20   Ht 5\' 2"  (1.575 m)   Wt 87.5 kg   SpO2 100%   BMI 35.30 kg/m   Physical Exam  Constitutional: She is oriented to person, place, and time. She appears well-developed and well-nourished. No distress.  HENT:  Head: Normocephalic and atraumatic.  Eyes: Conjunctivae are normal. Right eye exhibits no discharge. Left eye exhibits no  discharge.  Neck: Normal range of motion. Neck supple.  Cardiovascular: Normal rate and regular rhythm.   Pulmonary/Chest: Effort normal and breath sounds normal. No respiratory distress.  Abdominal: Soft. Bowel sounds are normal. She exhibits distension (indeterminate, obese). She exhibits no mass. There is no tenderness. There is no rebound and no guarding.  Neg cvat Neg murphys sign  Genitourinary:  Genitourinary Comments: Rectal wo stool sample; external skin tag/old hemorrhoid  Musculoskeletal: She exhibits no edema.  Neurological: She is alert and oriented to person, place, and time.  Skin: Skin is warm. No rash noted.  Psychiatric: She has a normal mood and affect.  Nursing note and vitals reviewed.    ED Treatments / Results  Labs (all labs ordered are listed, but only abnormal results are displayed) Labs Reviewed  COMPREHENSIVE METABOLIC PANEL - Abnormal; Notable for the following:       Result Value   Glucose, Bld 102 (*)    All other components within normal limits  URINALYSIS, ROUTINE W REFLEX MICROSCOPIC - Abnormal; Notable for the following:    Specific Gravity, Urine >1.030 (*)    Hgb urine dipstick TRACE (*)    All other components within normal limits  URINALYSIS, MICROSCOPIC (REFLEX) - Abnormal; Notable for the following:    Bacteria, UA RARE (*)    Squamous Epithelial / LPF 6-30 (*)    All other components within normal limits  LIPASE, BLOOD  CBC  PREGNANCY, URINE  POC URINE PREG, ED    EKG  EKG Interpretation None       Radiology Ct Abdomen Pelvis W Contrast  Result Date: 10/01/2016 CLINICAL DATA:  Acute onset of generalized abdominal pain, diarrhea and vomiting. Initial encounter. EXAM: CT ABDOMEN AND PELVIS WITH CONTRAST TECHNIQUE: Multidetector CT imaging of the abdomen and pelvis was performed using the standard protocol following bolus administration of intravenous contrast. CONTRAST:  ISOVUE-300 IOPAMIDOL (ISOVUE-300) INJECTION 61%  COMPARISON:  Abdominal radiographs performed 01/06/2016 FINDINGS: Lower chest: The visualized lung bases are grossly clear. The visualized portions of the mediastinum are unremarkable. Hepatobiliary: The liver is unremarkable in appearance. The patient appears to be status post cholecystectomy. The common bile duct remains normal in caliber. Pancreas: The pancreas is within normal limits. Spleen: The spleen is unremarkable in appearance. Adrenals/Urinary Tract: The adrenal glands are unremarkable in appearance. A small right renal cyst is noted. The kidneys are otherwise unremarkable. There is no evidence of hydronephrosis. No renal or ureteral stones are identified. No perinephric stranding is seen. Stomach/Bowel: The stomach is unremarkable in appearance. The small bowel is within normal limits. The appendix is normal in caliber, without evidence of appendicitis. The colon is unremarkable in appearance. Vascular/Lymphatic: The abdominal aorta is unremarkable in appearance. The inferior vena cava is grossly unremarkable. No retroperitoneal lymphadenopathy is seen. No pelvic sidewall lymphadenopathy is identified. Reproductive: The bladder is mildly distended and grossly unremarkable. Multiple uterine  fibroids are seen. The ovaries are relatively symmetric. No suspicious adnexal masses are seen. Other: No additional soft tissue abnormalities are seen. Musculoskeletal: No acute osseous abnormalities are identified. The visualized musculature is unremarkable in appearance. IMPRESSION: 1. No acute abnormality seen within the abdomen or pelvis. 2. Multiple uterine fibroids seen. 3. Small right renal cyst noted. Electronically Signed   By: Roanna Raider M.D.   On: 10/01/2016 23:53    Procedures Procedures (including critical care time)  Medications Ordered in ED Medications  metoCLOPramide (REGLAN) injection 10 mg (10 mg Intravenous Refused 10/01/16 2212)  iopamidol (ISOVUE-300) 61 % injection (100 mLs  Contrast  Given 10/01/16 2338)     Initial Impression / Assessment and Plan / ED Course  I have reviewed the triage vital signs and the nursing notes.  Pertinent labs & imaging results that were available during my care of the patient were reviewed by me and considered in my medical decision making (see chart for details).     Sx most consistent with taking too many laxatives with possible gastroparesis. Already on PPI. Feels distended and hx DM. Reglan given and pt did not vomit in ED, tolerating PO. CT A/P negative. Labs reassuring. Has fu with GI for endoscopy and colonoscopy. Advised to stop laxatives and told of CT's fibroids. Strict return precautions. DC in good condition. Doubt ACS - no CP/SOB.   Final Clinical Impressions(s) / ED Diagnoses   Final diagnoses:  Abdominal pain, unspecified abdominal location    New Prescriptions New Prescriptions   METOCLOPRAMIDE (REGLAN) 10 MG TABLET    Take 1 tablet (10 mg total) by mouth every 8 (eight) hours as needed for nausea, vomiting or refractory nausea / vomiting.     Sidney Ace, MD 10/02/16 0030    Cathren Laine, MD 10/07/16 [phone]

## 2016-10-01 NOTE — ED Triage Notes (Signed)
Pt complaining of abdominal pain. Pt states seen by PCP recently, given laxities. Pt states 2 episodes of diarrhea today. Pt states emesis x 3 days. Pt denies any emesis today. Pt denies any urinary symptoms.

## 2016-10-01 NOTE — ED Notes (Signed)
Dr. Franklyn Loruch at bedside.

## 2016-10-02 MED ORDER — METOCLOPRAMIDE HCL 10 MG PO TABS: 10.0000 mg | ORAL_TABLET | Freq: Three times a day (TID) | ORAL | 0 refills | Status: DC | PRN

## 2016-10-02 NOTE — ED Notes (Signed)
Pt stable, understands discharge instructions, and reasons for return.   

## 2016-10-02 NOTE — Discharge Instructions (Signed)
1. No acute abnormality seen within the abdomen or pelvis. 2. Multiple uterine fibroids seen. 3. Small right renal cyst noted.  Stop your laxatives

## 2016-12-03 ENCOUNTER — Ambulatory Visit: Payer: Self-pay | Admitting: Gynecology

## 2016-12-31 ENCOUNTER — Encounter: Payer: Self-pay | Admitting: Gynecology

## 2018-03-11 ENCOUNTER — Emergency Department (HOSPITAL_COMMUNITY): Payer: BLUE CROSS/BLUE SHIELD

## 2018-03-11 ENCOUNTER — Encounter (HOSPITAL_COMMUNITY): Payer: Self-pay

## 2018-03-11 ENCOUNTER — Emergency Department (HOSPITAL_COMMUNITY)
Admission: EM | Admit: 2018-03-11 | Discharge: 2018-03-11 | Disposition: A | Discharge status: Home / Self Care | Payer: BLUE CROSS/BLUE SHIELD | Attending: Emergency Medicine | Admitting: Emergency Medicine

## 2018-03-11 DIAGNOSIS — M62838 Other muscle spasm: Secondary | ICD-10-CM | POA: Diagnosis not present

## 2018-03-11 DIAGNOSIS — G4489 Other headache syndrome: Secondary | ICD-10-CM | POA: Insufficient documentation

## 2018-03-11 DIAGNOSIS — J45909 Unspecified asthma, uncomplicated: Secondary | ICD-10-CM | POA: Diagnosis not present

## 2018-03-11 DIAGNOSIS — Z79899 Other long term (current) drug therapy: Secondary | ICD-10-CM | POA: Insufficient documentation

## 2018-03-11 DIAGNOSIS — R51 Headache: Secondary | ICD-10-CM | POA: Diagnosis present

## 2018-03-11 DIAGNOSIS — I10 Essential (primary) hypertension: Secondary | ICD-10-CM | POA: Diagnosis not present

## 2018-03-11 LAB — BASIC METABOLIC PANEL
Anion gap: 9 (ref 5–15)
BUN: 9 mg/dL (ref 6–20)
CALCIUM: 9.5 mg/dL (ref 8.9–10.3)
CO2: 25 mmol/L (ref 22–32)
CREATININE: 0.57 mg/dL (ref 0.44–1.00)
Chloride: 104 mmol/L (ref 98–111)
Glucose, Bld: 120 mg/dL — ABNORMAL HIGH (ref 70–99)
Potassium: 4.5 mmol/L (ref 3.5–5.1)
SODIUM: 138 mmol/L (ref 135–145)

## 2018-03-11 LAB — CBC WITH DIFFERENTIAL/PLATELET
Abs Immature Granulocytes: 0 10*3/uL (ref 0.0–0.1)
BASOS ABS: 0 10*3/uL (ref 0.0–0.1)
BASOS PCT: 0 %
Eosinophils Absolute: 0.2 10*3/uL (ref 0.0–0.7)
Eosinophils Relative: 3 %
HCT: 36.9 % (ref 36.0–46.0)
Hemoglobin: 11.5 g/dL — ABNORMAL LOW (ref 12.0–15.0)
Immature Granulocytes: 0 %
LYMPHS PCT: 30 %
Lymphs Abs: 1.6 10*3/uL (ref 0.7–4.0)
MCH: 28.3 pg (ref 26.0–34.0)
MCHC: 31.2 g/dL (ref 30.0–36.0)
MCV: 90.7 fL (ref 78.0–100.0)
MONO ABS: 0.6 10*3/uL (ref 0.1–1.0)
Monocytes Relative: 12 %
Neutro Abs: 2.9 10*3/uL (ref 1.7–7.7)
Neutrophils Relative %: 55 %
PLATELETS: 201 10*3/uL (ref 150–400)
RBC: 4.07 MIL/uL (ref 3.87–5.11)
RDW: 13.7 % (ref 11.5–15.5)
WBC: 5.2 10*3/uL (ref 4.0–10.5)

## 2018-03-11 MED ORDER — PROMETHAZINE HCL 25 MG PO TABS: 25.0000 mg | ORAL_TABLET | Freq: Four times a day (QID) | ORAL | 0 refills | Status: DC | PRN

## 2018-03-11 MED ORDER — SODIUM CHLORIDE 0.9 % IV SOLN
INTRAVENOUS | Status: DC
  Administered 2018-03-11: 17:00:00 via INTRAVENOUS

## 2018-03-11 MED ORDER — DIAZEPAM 5 MG PO TABS
5.0000 mg | ORAL_TABLET | Freq: Once | ORAL | Status: AC
  Administered 2018-03-11: 5 mg via ORAL
  Filled 2018-03-11: qty 1

## 2018-03-11 MED ORDER — METOCLOPRAMIDE HCL 5 MG/ML IJ SOLN
10.0000 mg | Freq: Once | INTRAMUSCULAR | Status: AC
  Administered 2018-03-11: 10 mg via INTRAVENOUS
  Filled 2018-03-11: qty 2

## 2018-03-11 MED ORDER — DIPHENHYDRAMINE HCL 50 MG/ML IJ SOLN
25.0000 mg | Freq: Once | INTRAMUSCULAR | Status: AC
  Administered 2018-03-11: 25 mg via INTRAVENOUS
  Filled 2018-03-11: qty 1

## 2018-03-11 MED ORDER — METHOCARBAMOL 500 MG PO TABS: 500.0000 mg | ORAL_TABLET | Freq: Two times a day (BID) | ORAL | 0 refills | Status: DC

## 2018-03-11 MED ORDER — MAGNESIUM SULFATE 2 GM/50ML IV SOLN
2.0000 g | Freq: Once | INTRAVENOUS | Status: AC
  Administered 2018-03-11: 2 g via INTRAVENOUS
  Filled 2018-03-11: qty 50

## 2018-03-11 MED ORDER — DEXAMETHASONE SODIUM PHOSPHATE 10 MG/ML IJ SOLN
10.0000 mg | Freq: Once | INTRAMUSCULAR | Status: AC
  Administered 2018-03-11: 10 mg via INTRAVENOUS
  Filled 2018-03-11: qty 1

## 2018-03-11 NOTE — ED Provider Notes (Signed)
[facility] EMERG[facility] DEPARTMENT Provider Note   CSN: [id number] Arrival date & time: 03/11/18  1535     History   Chief Complaint Chief Complaint  Patient presents with  . Headache    HPI Loretta Carroll is a 52 y.o. female who presents to the ED with c/o headache. The headache started July 11 and has continued. Patient saw her PCP 4 days ago and then again 3 days ago. Patient reports being treated with Toradol injection that has not helped. Patient c/o nausea, photophobia like she does with regular headaches but reports this headache is worse.  HPI  Past Medical History:  Diagnosis Date  . Asthma   . Hypertension   . Migraine     Patient Active Problem List   Diagnosis Date Noted  . Migraine     History reviewed. No pertinent surgical history.   OB History   None      Home Medications    Prior to Admission medications   Medication Sig Start Date End Date Taking? Authorizing Provider  albuterol (PROVENTIL HFA;VENTOLIN HFA) 108 (90 BASE) MCG/ACT inhaler Inhale 2 puffs into the lungs every 6 (six) hours as needed for shortness of breath.    [provider]  albuterol (PROVENTIL) (2.5 MG/3ML) 0.083% nebulizer solution Take 2.5 mg by nebulization every 6 (six) hours as needed for wheezing or shortness of breath.    [provider]  budesonide (PULMICORT) 180 MCG/ACT inhaler Inhale 1 puff into the lungs every morning.    [provider]  methocarbamol (ROBAXIN) 500 MG tablet Take 1 tablet (500 mg total) by mouth 2 (two) times daily. 03/11/18   Ashley Murrain, NP  montelukast (SINGULAIR) 10 MG tablet Take 10 mg by mouth at bedtime.    [provider]  omeprazole (PRILOSEC) 40 MG capsule Take 40 mg by mouth daily.    [provider]  promethazine (PHENERGAN) 25 MG tablet Take 1 tablet (25 mg total) by mouth every 6 (six) hours as needed for nausea or vomiting. 03/11/18   Ashley Murrain, NP  ranitidine (ZANTAC) 150 MG  tablet Take 150 mg by mouth 2 (two) times daily.    [provider]  simethicone (MYLICON) 80 MG chewable tablet Chew 80 mg by mouth every 6 (six) hours as needed for flatulence.    [provider]  SUMAtriptan (IMITREX) 100 MG tablet Take 100 mg by mouth every 2 (two) hours as needed for migraine or headache. May repeat in 2 hours if headache persists or recurs.    [provider]    Family History History reviewed. No pertinent family history.  Social History Social History   Tobacco Use  . Smoking status: Never Smoker  . Smokeless tobacco: Never Used  Substance Use Topics  . Alcohol use: No  . Drug use: No     Allergies   Shellfish allergy   Review of Systems Review of Systems  Constitutional: Negative for chills and fever.  HENT: Negative.   Eyes: Positive for photophobia.  Respiratory: Negative for shortness of breath.   Cardiovascular: Negative for chest pain.  Gastrointestinal: Positive for nausea. Negative for abdominal pain.  Musculoskeletal: Negative for back pain.  Skin: Negative for rash.  Neurological: Positive for headaches. Negative for syncope.  Psychiatric/Behavioral: Negative for confusion.     Physical Exam Updated Vital Signs BP 116/75   Pulse 84   Temp 99.2 F (37.3 C) (Oral)   Resp 18   Ht 5'  1" (1.549 m)   Wt 91.6 kg (202 lb)   LMP 03/01/2018 (Approximate)   SpO2 96%   BMI 38.17 kg/m   Physical Exam  Constitutional: She is oriented to person, place, and time. She appears well-developed and well-nourished. No distress.  Eyes: Pupils are equal, round, and reactive to light. EOM are normal. Right eye exhibits no nystagmus. Left eye exhibits no nystagmus.  Neck: Normal range of motion. Neck supple.  Cardiovascular: Normal rate.  Pulmonary/Chest: Effort normal.  Abdominal: Soft. There is no tenderness.  Musculoskeletal: Normal range of motion.  Neurological: She is alert and oriented to person, place, and time.  She has normal strength. No cranial nerve deficit or sensory deficit. She displays a negative Romberg sign. Coordination and gait normal.  Reflex Scores:      Bicep reflexes are 2+ on the right side and 2+ on the left side.      Brachioradialis reflexes are 2+ on the right side and 2+ on the left side.      Patellar reflexes are 2+ on the right side and 2+ on the left side. Skin: Skin is warm and dry.  Psychiatric: She has a normal mood and affect. Her behavior is normal.  Nursing note and vitals reviewed.    ED Treatments / Results  Labs (all labs ordered are listed, but only abnormal results are displayed) Labs Reviewed  CBC WITH DIFFERENTIAL/PLATELET - Abnormal; Notable for the following components:      Result Value   Hemoglobin 11.5 (*)    All other components within normal limits  BASIC METABOLIC PANEL - Abnormal; Notable for the following components:   Glucose, Bld 120 (*)    All other components within normal limits   Radiology Ct Head Wo Contrast  Result Date: 03/11/2018 CLINICAL DATA:  Headache behind both ears for the past 2 weeks. EXAM: CT HEAD WITHOUT CONTRAST TECHNIQUE: Contiguous axial images were obtained from the base of the skull through the vertex without intravenous contrast. COMPARISON:  Brain MR dated 04/14/2014 and head CT dated 04/14/2014. FINDINGS: Brain: Normal appearing cerebral hemispheres and posterior fossa structures. Normal size and position of the ventricles. No intracranial hemorrhage, mass lesion or CT evidence of acute infarction. Vascular: No hyperdense vessel or unexpected calcification. Skull: Normal. Negative for fracture or focal lesion. Sinuses/Orbits: Right maxillary sinus retention cyst. Unremarkable orbits. Other: None. IMPRESSION: No acute abnormality. Electronically Signed   By: Claudie Revering M.D.   On: 03/11/2018 17:22    Procedures Procedures (including critical care time)  Medications Ordered in ED Medications  0.9 %  sodium chloride  infusion ( Intravenous Stopped 03/11/18 1828)  dexamethasone (DECADRON) injection 10 mg (10 mg Intravenous Given 03/11/18 1721)  diphenhydrAMINE (BENADRYL) injection 25 mg (25 mg Intravenous Given 03/11/18 1720)  metoCLOPramide (REGLAN) injection 10 mg (10 mg Intravenous Given 03/11/18 1721)  magnesium sulfate IVPB 2 g 50 mL (0 g Intravenous Stopped 03/11/18 2042)  diazepam (VALIUM) tablet 5 mg (5 mg Oral Given 03/11/18 1940)     Initial Impression / Assessment and Plan / ED Course  I have reviewed the triage vital signs and the nursing notes. SUBJECTIVE: Sophie Bloodgood is a 52 y.o. female who complains of headaches for one week Description of pain: squeezing pain, bilateral in the occipital area. Duration of individual headaches:  Associated symptoms: nausea and visual disturbance. Pain relief: unable to obtain relief with OTC meds, OTC NSAID's and prescription medications - NSAID's and Toradol injections PRN. Precipitating factors: patient is aware  of none. She denies a history of recent head injury.  Prior neurological history: negative for no neurological problems.  OBJECTIVE: Appearance: alert, well appearing, and in no distress. Neurological Exam: alert, oriented, normal speech, no focal findings or movement disorder noted.  ASSESSMENT: mixed tension and migraine headache.  PLAN: Recommendations: lie in darkened room and apply cold packs prn for pain and patient reassured that CT scan is normal and normal neuro exam. See orders for this visit as documented in the electronic medical record. Encouraged patient to discuss with her PCP neurology follow up. Patient's pain resolved after treatment in the ED.  Final Clinical Impressions(s) / ED Diagnoses   Final diagnoses:  Other headache syndrome  Muscle spasm    ED Discharge Orders        Ordered    methocarbamol (ROBAXIN) 500 MG tablet  2 times daily     03/11/18 2047    promethazine (PHENERGAN) 25 MG tablet  Every 6 hours PRN      03/11/18 2047       Debroah Baller Boley, NP 03/11/18 2144    Duffy Bruce, MD 03/11/18 [phone]

## 2018-03-11 NOTE — ED Triage Notes (Signed)
Pt presents with migraine headache since 7/11.  Pt was seen at PCP on Monday and Tuesday, received toradol injection which has not helped.  Pt reports typical headaches are generalized.  Pt reports normally, she has nausea and photophobia but since yesterday, she has several swollen areas to head that are very painful to palpation with pain behind them.

## 2018-03-11 NOTE — Discharge Instructions (Addendum)
Follow up with your doctor to discuss your headaches and possible referral to a neurologist. Do not take the muscle relaxer or the medication for nausea if you are driving because they can make you sleepy.

## 2019-04-27 ENCOUNTER — Emergency Department (HOSPITAL_COMMUNITY)
Admission: EM | Admit: 2019-04-27 | Discharge: 2019-04-28 | Discharge status: Against Medical Advice | Payer: BLUE CROSS/BLUE SHIELD | Attending: Emergency Medicine | Admitting: Emergency Medicine

## 2019-04-27 ENCOUNTER — Other Ambulatory Visit: Payer: Self-pay

## 2019-04-27 ENCOUNTER — Encounter (HOSPITAL_COMMUNITY): Payer: Self-pay | Admitting: Emergency Medicine

## 2019-04-27 DIAGNOSIS — Z5321 Procedure and treatment not carried out due to patient leaving prior to being seen by health care provider: Secondary | ICD-10-CM | POA: Insufficient documentation

## 2019-04-27 DIAGNOSIS — M549 Dorsalgia, unspecified: Secondary | ICD-10-CM | POA: Diagnosis present

## 2019-04-27 LAB — BASIC METABOLIC PANEL
Anion gap: 8 (ref 5–15)
BUN: 12 mg/dL (ref 6–20)
CO2: 25 mmol/L (ref 22–32)
Calcium: 9.2 mg/dL (ref 8.9–10.3)
Chloride: 104 mmol/L (ref 98–111)
Creatinine, Ser: 0.4 mg/dL — ABNORMAL LOW (ref 0.44–1.00)
GFR calc Af Amer: >60 mL/min (ref >60)
GFR calc non Af Amer: >60 mL/min (ref >60)
Glucose, Bld: 95 mg/dL (ref 70–99)
Potassium: 3.9 mmol/L (ref 3.5–5.1)
Sodium: 137 mmol/L (ref 135–145)

## 2019-04-27 LAB — CBC WITH DIFFERENTIAL/PLATELET
Abs Immature Granulocytes: 0.01 10*3/uL (ref 0.00–0.07)
Basophils Absolute: 0 10*3/uL (ref 0.0–0.1)
Basophils Relative: 1 %
Eosinophils Absolute: 0.2 10*3/uL (ref 0.0–0.5)
Eosinophils Relative: 3 %
HCT: 36.9 % (ref 36.0–46.0)
Hemoglobin: 11.6 g/dL — ABNORMAL LOW (ref 12.0–15.0)
Immature Granulocytes: 0 %
Lymphocytes Relative: 40 %
Lymphs Abs: 2.3 10*3/uL (ref 0.7–4.0)
MCH: 28.7 pg (ref 26.0–34.0)
MCHC: 31.4 g/dL (ref 30.0–36.0)
MCV: 91.3 fL (ref 80.0–100.0)
Monocytes Absolute: 0.6 10*3/uL (ref 0.1–1.0)
Monocytes Relative: 10 %
Neutro Abs: 2.6 10*3/uL (ref 1.7–7.7)
Neutrophils Relative %: 46 %
Platelets: 185 10*3/uL (ref 150–400)
RBC: 4.04 MIL/uL (ref 3.87–5.11)
RDW: 13.2 % (ref 11.5–15.5)
WBC: 5.7 10*3/uL (ref 4.0–10.5)
nRBC: 0 % (ref 0.0–0.2)

## 2019-04-27 LAB — URINALYSIS, ROUTINE W REFLEX MICROSCOPIC
Bilirubin Urine: NEGATIVE
Glucose, UA: NEGATIVE mg/dL
Ketones, ur: NEGATIVE mg/dL
Leukocytes,Ua: NEGATIVE
Nitrite: NEGATIVE
Protein, ur: NEGATIVE mg/dL
Specific Gravity, Urine: 1.012 (ref 1.005–1.030)
pH: 6 (ref 5.0–8.0)

## 2019-04-27 LAB — I-STAT BETA HCG BLOOD, ED (MC, WL, AP ONLY): I-stat hCG, quantitative: <5 m[IU]/mL (ref <5)

## 2019-04-27 NOTE — ED Triage Notes (Signed)
Patient reports upper right flank pain onset last week , denies injury , no urinary discomfort or hematuria . Denies fever or chills.

## 2019-04-28 NOTE — ED Notes (Signed)
Called for pt x3. No answer.  

## 2019-04-28 NOTE — ED Notes (Signed)
Pt left ED without being seen

## 2019-08-17 ENCOUNTER — Ambulatory Visit: Payer: BLUE CROSS/BLUE SHIELD | Attending: Internal Medicine

## 2019-08-17 DIAGNOSIS — Z20822 Contact with and (suspected) exposure to covid-19: Secondary | ICD-10-CM

## 2019-08-18 LAB — NOVEL CORONAVIRUS, NAA: SARS-CoV-2, NAA: NOT DETECTED

## 2020-01-14 ENCOUNTER — Other Ambulatory Visit: Payer: Self-pay

## 2020-01-14 ENCOUNTER — Inpatient Hospital Stay (HOSPITAL_BASED_OUTPATIENT_CLINIC_OR_DEPARTMENT_OTHER)
Admission: EM | Admit: 2020-01-14 | Discharge: 2020-01-16 | DRG: 372 | Disposition: A | Discharge status: Home / Self Care | Payer: BLUE CROSS/BLUE SHIELD | Attending: Internal Medicine | Admitting: Internal Medicine

## 2020-01-14 ENCOUNTER — Emergency Department (HOSPITAL_BASED_OUTPATIENT_CLINIC_OR_DEPARTMENT_OTHER): Payer: BLUE CROSS/BLUE SHIELD

## 2020-01-14 ENCOUNTER — Encounter (HOSPITAL_BASED_OUTPATIENT_CLINIC_OR_DEPARTMENT_OTHER): Payer: Self-pay | Admitting: Emergency Medicine

## 2020-01-14 DIAGNOSIS — J4520 Mild intermittent asthma, uncomplicated: Secondary | ICD-10-CM | POA: Diagnosis present

## 2020-01-14 DIAGNOSIS — A09 Infectious gastroenteritis and colitis, unspecified: Secondary | ICD-10-CM | POA: Diagnosis present

## 2020-01-14 DIAGNOSIS — A045 Campylobacter enteritis: Secondary | ICD-10-CM | POA: Diagnosis present

## 2020-01-14 DIAGNOSIS — E876 Hypokalemia: Secondary | ICD-10-CM | POA: Diagnosis present

## 2020-01-14 DIAGNOSIS — Z91013 Allergy to seafood: Secondary | ICD-10-CM | POA: Diagnosis not present

## 2020-01-14 DIAGNOSIS — G43909 Migraine, unspecified, not intractable, without status migrainosus: Secondary | ICD-10-CM | POA: Diagnosis present

## 2020-01-14 DIAGNOSIS — K219 Gastro-esophageal reflux disease without esophagitis: Secondary | ICD-10-CM | POA: Diagnosis present

## 2020-01-14 DIAGNOSIS — K5100 Ulcerative (chronic) pancolitis without complications: Secondary | ICD-10-CM | POA: Diagnosis present

## 2020-01-14 DIAGNOSIS — I10 Essential (primary) hypertension: Secondary | ICD-10-CM | POA: Diagnosis present

## 2020-01-14 DIAGNOSIS — Z7951 Long term (current) use of inhaled steroids: Secondary | ICD-10-CM | POA: Diagnosis not present

## 2020-01-14 DIAGNOSIS — Z20822 Contact with and (suspected) exposure to covid-19: Secondary | ICD-10-CM | POA: Diagnosis present

## 2020-01-14 DIAGNOSIS — R933 Abnormal findings on diagnostic imaging of other parts of digestive tract: Secondary | ICD-10-CM | POA: Diagnosis not present

## 2020-01-14 DIAGNOSIS — E861 Hypovolemia: Secondary | ICD-10-CM | POA: Diagnosis present

## 2020-01-14 DIAGNOSIS — R1030 Lower abdominal pain, unspecified: Secondary | ICD-10-CM | POA: Diagnosis not present

## 2020-01-14 LAB — URINALYSIS, ROUTINE W REFLEX MICROSCOPIC
Bilirubin Urine: NEGATIVE
Glucose, UA: NEGATIVE mg/dL
Ketones, ur: NEGATIVE mg/dL
Leukocytes,Ua: NEGATIVE
Nitrite: NEGATIVE
Protein, ur: NEGATIVE mg/dL
Specific Gravity, Urine: 1.025 (ref 1.005–1.030)
pH: 6 (ref 5.0–8.0)

## 2020-01-14 LAB — CBC WITH DIFFERENTIAL/PLATELET
Abs Immature Granulocytes: 0.04 10*3/uL (ref 0.00–0.07)
Basophils Absolute: 0 10*3/uL (ref 0.0–0.1)
Basophils Relative: 1 %
Eosinophils Absolute: 0.1 10*3/uL (ref 0.0–0.5)
Eosinophils Relative: 2 %
HCT: 34.8 % — ABNORMAL LOW (ref 36.0–46.0)
Hemoglobin: 11.3 g/dL — ABNORMAL LOW (ref 12.0–15.0)
Immature Granulocytes: 1 %
Lymphocytes Relative: 34 %
Lymphs Abs: 1.5 10*3/uL (ref 0.7–4.0)
MCH: 30 pg (ref 26.0–34.0)
MCHC: 32.5 g/dL (ref 30.0–36.0)
MCV: 92.3 fL (ref 80.0–100.0)
Monocytes Absolute: 1.1 10*3/uL — ABNORMAL HIGH (ref 0.1–1.0)
Monocytes Relative: 27 %
Neutro Abs: 1.5 10*3/uL — ABNORMAL LOW (ref 1.7–7.7)
Neutrophils Relative %: 35 %
Platelets: 230 10*3/uL (ref 150–400)
RBC: 3.77 MIL/uL — ABNORMAL LOW (ref 3.87–5.11)
RDW: 13.7 % (ref 11.5–15.5)
Smear Review: NORMAL
WBC: 4.3 10*3/uL (ref 4.0–10.5)
nRBC: 0 % (ref 0.0–0.2)

## 2020-01-14 LAB — COMPREHENSIVE METABOLIC PANEL
ALT: 16 U/L (ref 0–44)
AST: 18 U/L (ref 15–41)
Albumin: 3 g/dL — ABNORMAL LOW (ref 3.5–5.0)
Alkaline Phosphatase: 38 U/L (ref 38–126)
Anion gap: 8 (ref 5–15)
BUN: 6 mg/dL (ref 6–20)
CO2: 26 mmol/L (ref 22–32)
Calcium: 7.5 mg/dL — ABNORMAL LOW (ref 8.9–10.3)
Chloride: 103 mmol/L (ref 98–111)
Creatinine, Ser: 0.51 mg/dL (ref 0.44–1.00)
GFR calc Af Amer: >60 mL/min (ref >60)
GFR calc non Af Amer: >60 mL/min (ref >60)
Glucose, Bld: 96 mg/dL (ref 70–99)
Potassium: 3.2 mmol/L — ABNORMAL LOW (ref 3.5–5.1)
Sodium: 137 mmol/L (ref 135–145)
Total Bilirubin: 0.2 mg/dL — ABNORMAL LOW (ref 0.3–1.2)
Total Protein: 6.8 g/dL (ref 6.5–8.1)

## 2020-01-14 LAB — LACTIC ACID, PLASMA: Lactic Acid, Venous: 1 mmol/L (ref 0.5–1.9)

## 2020-01-14 LAB — URINALYSIS, MICROSCOPIC (REFLEX)

## 2020-01-14 LAB — OCCULT BLOOD X 1 CARD TO LAB, STOOL: Fecal Occult Bld: POSITIVE — AB

## 2020-01-14 LAB — LIPASE, BLOOD: Lipase: 25 U/L (ref 11–51)

## 2020-01-14 LAB — PREGNANCY, URINE: Preg Test, Ur: NEGATIVE

## 2020-01-14 MED ORDER — POTASSIUM CHLORIDE CRYS ER 20 MEQ PO TBCR
40.0000 meq | EXTENDED_RELEASE_TABLET | Freq: Once | ORAL | Status: AC
  Administered 2020-01-14: 40 meq via ORAL
  Filled 2020-01-14: qty 2

## 2020-01-14 MED ORDER — MORPHINE SULFATE (PF) 4 MG/ML IV SOLN
4.0000 mg | Freq: Once | INTRAVENOUS | Status: DC
  Filled 2020-01-14: qty 1

## 2020-01-14 MED ORDER — SODIUM CHLORIDE 0.9 % IV BOLUS
1000.0000 mL | Freq: Once | INTRAVENOUS | Status: AC
  Administered 2020-01-14: 1000 mL via INTRAVENOUS

## 2020-01-14 MED ORDER — IOHEXOL 300 MG/ML  SOLN
100.0000 mL | Freq: Once | INTRAMUSCULAR | Status: AC | PRN
  Administered 2020-01-14: 100 mL via INTRAVENOUS

## 2020-01-14 MED ORDER — SODIUM CHLORIDE 0.9 % IV SOLN: Freq: Once | INTRAVENOUS | Status: AC

## 2020-01-14 MED ORDER — ACETAMINOPHEN 500 MG PO TABS
1000.0000 mg | ORAL_TABLET | Freq: Once | ORAL | Status: AC
  Administered 2020-01-14: 1000 mg via ORAL
  Filled 2020-01-14: qty 2

## 2020-01-14 MED ORDER — SODIUM CHLORIDE FLUSH 0.9 % IV SOLN
5.00 | INTRAVENOUS | Status: DC
Start: ? — End: 2020-01-14

## 2020-01-14 MED ORDER — ONDANSETRON HCL 4 MG/2ML IJ SOLN
4.0000 mg | Freq: Once | INTRAMUSCULAR | Status: DC
  Filled 2020-01-14: qty 2

## 2020-01-14 NOTE — ED Triage Notes (Signed)
Pt c/o lower abdominal pain, diarrhea and rectal bleeding x 5 days. Pt  reports fever over last 5 days. Recently treated for HA and fever in ED, tested negative for Covid.

## 2020-01-14 NOTE — Progress Notes (Signed)
Patient presented to [facility] with several days of diarrhea (19 episodes thus far today) and also bloody diarrhea. Was at [facility] yesterday and received IVF and sent home. CT Abd/Pelv shows pancolitis. C Diff panel pending. Patient was on recent abx. Will accept admit for transfer to Med-Surg bed.   Barrington Ellison, MD [facility]

## 2020-01-14 NOTE — ED Notes (Signed)
[facility] notified [facility]) - patient ready for transport

## 2020-01-14 NOTE — ED Provider Notes (Signed)
[facility]PART[facility] Provider Note   CSN: [id number] Arrival date & time: 01/14/20  1845     History Chief Complaint  Patient presents with  . Abdominal Pain    Loretta Carroll is a 54 y.o. female.  Loretta Carroll is a 54 y.o. female with a history of hypertension, asthma and migraines, who presents to the emergency department for evaluation of body aches, fevers, chills, abdominal pain and diarrhea since Tuesday.  Symptoms have been persistent and worsening.  She was seen in the emergency department at [facility] yesterday had lab work, chest x-ray and Covid testing done that was reassuring, she was given 2 L of fluid, Bentyl and Zofran with some improvement.  She returns today due to worsening lower abdominal pain which she reports is a constant severe pain.  She states that today she started to notice some blood in her stools.  She estimates that she has had about 13 episodes of diarrhea prior to arrival and since being here in the emergency department has had 5 additional episodes of diarrhea.  She is not having nausea or vomiting.  But she reports anytime she tries to eat or drink anything she immediately has diarrhea and feels like she cannot keep anything in her system.  She has had intermittent fevers, febrile to 100.7 on arrival.  Denies any urinary symptoms.  Reports an occasional cough, but yesterday had negative Covid testing reassuring chest x-ray.  Was recently in [place] visiting family members.  Also reports that while in [place] she was prescribed amoxicillin for sore throat, sore throat has resolved, she tried taking some of the same antibiotic again a few days ago when symptoms began but felt like it was making things worse rather than better so stopped.  Spanish interpreter used to obtain history.        Past Medical History:  Diagnosis Date  . Asthma   . Hypertension   . Migraine     Patient Active Problem List   Diagnosis Date Noted   . Migraine     History reviewed. No pertinent surgical history.   OB History   No obstetric history on file.     History reviewed. No pertinent family history.  Social History   Tobacco Use  . Smoking status: Never Smoker  . Smokeless tobacco: Never Used  Substance Use Topics  . Alcohol use: No  . Drug use: No    Home Medications Prior to Admission medications   Medication Sig Start Date End Date Taking? Authorizing Provider  albuterol (PROVENTIL HFA;VENTOLIN HFA) 108 (90 BASE) MCG/ACT inhaler Inhale 2 puffs into the lungs every 6 (six) hours as needed for shortness of breath.    [provider]  albuterol (PROVENTIL) (2.5 MG/3ML) 0.083% nebulizer solution Take 2.5 mg by nebulization every 6 (six) hours as needed for wheezing or shortness of breath.    [provider]  budesonide (PULMICORT) 180 MCG/ACT inhaler Inhale 1 puff into the lungs every morning.    [provider]  methocarbamol (ROBAXIN) 500 MG tablet Take 1 tablet (500 mg total) by mouth 2 (two) times daily. 03/11/18   Ashley Murrain, NP  montelukast (SINGULAIR) 10 MG tablet Take 10 mg by mouth at bedtime.    [provider]  omeprazole (PRILOSEC) 40 MG capsule Take 40 mg by mouth daily.    [provider]  promethazine (PHENERGAN) 25 MG tablet Take 1 tablet (25 mg total) by mouth every 6 (six)  hours as needed for nausea or vomiting. 03/11/18   Ashley Murrain, NP  ranitidine (ZANTAC) 150 MG tablet Take 150 mg by mouth 2 (two) times daily.    [provider]  simethicone (MYLICON) 80 MG chewable tablet Chew 80 mg by mouth every 6 (six) hours as needed for flatulence.    [provider]  SUMAtriptan (IMITREX) 100 MG tablet Take 100 mg by mouth every 2 (two) hours as needed for migraine or headache. May repeat in 2 hours if headache persists or recurs.    [provider]    Allergies    Shellfish allergy  Review of Systems   Review of Systems   Constitutional: Positive for chills and fever.  HENT: Negative.   Respiratory: Positive for cough. Negative for shortness of breath.   Cardiovascular: Negative for chest pain.  Gastrointestinal: Positive for abdominal pain, blood in stool and diarrhea. Negative for nausea and vomiting.  Genitourinary: Negative for dysuria and frequency.  Musculoskeletal: Positive for myalgias. Negative for arthralgias.  Skin: Negative for color change and rash.  Neurological: Negative for syncope and light-headedness.    Physical Exam Updated Vital Signs BP (!) 119/52 (BP Location: Right Arm)   Pulse 94   Temp (!) 100.7 F (38.2 C) (Oral)   Ht 5' 2"  (1.575 m)   Wt 88.9 kg   LMP 01/10/2020   SpO2 98%   BMI 35.85 kg/m   Physical Exam Vitals and nursing note reviewed.  Constitutional:      General: She is not in acute distress.    Appearance: She is well-developed. She is ill-appearing. She is not diaphoretic.     Comments: Somewhat ill-appearing but in no acute distress  HENT:     Head: Normocephalic and atraumatic.     Mouth/Throat:     Comments: Mucous membranes dry, oropharynx clear Eyes:     General:        Right eye: No discharge.        Left eye: No discharge.     Pupils: Pupils are equal, round, and reactive to light.  Cardiovascular:     Rate and Rhythm: Normal rate and regular rhythm.     Heart sounds: Normal heart sounds.  Pulmonary:     Effort: Pulmonary effort is normal. No respiratory distress.     Breath sounds: Normal breath sounds. No wheezing or rales.     Comments: Respirations equal and unlabored, patient able to speak in full sentences, lungs clear to auscultation bilaterally Abdominal:     General: Bowel sounds are increased. There is no distension.     Palpations: Abdomen is soft. There is no mass.     Tenderness: There is abdominal tenderness in the right lower quadrant, suprapubic area and left lower quadrant. There is no guarding.     Comments: Abdomen is  soft, nondistended, bowel sounds increased, patient with generalized tenderness across the lower abdomen that does not localize to 1 side.  No guarding or peritoneal signs, no CVA tenderness bilaterally  Genitourinary:    Comments: No gross blood noted in stool Musculoskeletal:        General: No deformity.     Cervical back: Neck supple.  Skin:    General: Skin is warm and dry.     Capillary Refill: Capillary refill takes less than 2 seconds.  Neurological:     Mental Status: She is alert.     Coordination: Coordination normal.     Comments: Speech is clear, able  to follow commands Moves extremities without ataxia, coordination intact  Psychiatric:        Mood and Affect: Mood normal.        Behavior: Behavior normal.     ED Results / Procedures / Treatments   Labs (all labs ordered are listed, but only abnormal results are displayed) Labs Reviewed  COMPREHENSIVE METABOLIC PANEL - Abnormal; Notable for the following components:      Result Value   Potassium 3.2 (*)    Calcium 7.5 (*)    Albumin 3.0 (*)    Total Bilirubin 0.2 (*)    All other components within normal limits  CBC WITH DIFFERENTIAL/PLATELET - Abnormal; Notable for the following components:   RBC 3.77 (*)    Hemoglobin 11.3 (*)    HCT 34.8 (*)    All other components within normal limits  URINALYSIS, ROUTINE W REFLEX MICROSCOPIC - Abnormal; Notable for the following components:   APPearance HAZY (*)    Hgb urine dipstick LARGE (*)    All other components within normal limits  URINALYSIS, MICROSCOPIC (REFLEX) - Abnormal; Notable for the following components:   Bacteria, UA MANY (*)    All other components within normal limits  C DIFFICILE QUICK SCREEN W PCR REFLEX  GASTROINTESTINAL PANEL BY PCR, STOOL (REPLACES STOOL CULTURE)  LIPASE, BLOOD  PREGNANCY, URINE  LACTIC ACID, PLASMA  LACTIC ACID, PLASMA  OCCULT BLOOD X 1 CARD TO LAB, STOOL    EKG None  Radiology CT Abdomen Pelvis W Contrast  Result  Date: 01/14/2020 CLINICAL DATA:  54 year old female with abdominal pain and diarrhea. EXAM: CT ABDOMEN AND PELVIS WITH CONTRAST TECHNIQUE: Multidetector CT imaging of the abdomen and pelvis was performed using the standard protocol following bolus administration of intravenous contrast. CONTRAST:  12m OMNIPAQUE IOHEXOL 300 MG/ML  SOLN COMPARISON:  None. FINDINGS: Lower chest: The visualized lung bases are clear. No intra-abdominal free air or free fluid. Hepatobiliary: There are remarkable. No intrahepatic biliary ductal dilatation. Cholecystectomy. No retained calcified stone noted in the central CBD. Pancreas: Unremarkable. No pancreatic ductal dilatation or surrounding inflammatory changes. Spleen: Normal in size without focal abnormality. Adrenals/Urinary Tract: The adrenal glands are unremarkable. There is no hydronephrosis on either side. There is symmetric enhancement and excretion of contrast by both kidneys. Subcentimeter right renal hypodense focus is too small to characterize. The visualized ureters appear unremarkable. The urinary bladder is collapsed. Stomach/Bowel: Small hiatal hernia. There is diffuse thickening and inflammatory changes of the colon consistent with colitis. There is no bowel obstruction. The appendix is normal. Vascular/Lymphatic: The abdominal aorta and IVC unremarkable. No portal venous gas. There is no adenopathy. Reproductive: The uterus is anteverted. There is a 5 cm posterior fundal fibroid. Additional right body fibroid noted. There is a 3 cm left ovarian dominant follicle or corpus luteum. Other: None Musculoskeletal: No acute or significant osseous findings. IMPRESSION: 1. Pancolitis. Correlation with clinical exam and stool cultures recommended. No bowel obstruction. Normal appendix. 2. Uterine fibroids. Electronically Signed   By: AAnner CreteM.D.   On: 01/14/2020 21:22    Procedures Procedures (including critical care time)  Medications Ordered in  ED Medications  ondansetron ([facility] injection 4 mg (4 mg Intravenous Refused 01/14/20 2030)  morphine 4 MG/ML injection 4 mg (4 mg Intravenous Refused 01/14/20 2030)  sodium chloride 0.9 % bolus 1,000 mL (1,000 mLs Intravenous New Bag/Given 01/14/20 2059)  iohexol (OMNIPAQUE) 300 MG/ML solution 100 mL (100 mLs Intravenous Contrast Given 01/14/20 2103)    ED  Course  I have reviewed the triage vital signs and the nursing notes.  Pertinent labs & imaging results that were available during my care of the patient were reviewed by me and considered in my medical decision making (see chart for details).    MDM Rules/Calculators/A&P                      54 year old female presents with fevers, lower abdominal pain and profuse diarrhea.  Symptoms have been present and worsening for the past 5 days.  She was recently in [place] and was also recently on a course of amoxicillin.  She has been having greater than 10 bowel movements daily and today noticed small amounts of blood mixed into her stool.  Abdominal pain is worsening.  Was evaluated at [facility] ED last night but did not have abdominal imaging at this time.  Required 2 L of IV fluids last night.  Will get abdominal labs, lactic acid, given recent antibiotics we will also check.,  As well as GI pathogen panel, and CT abdomen pelvis.  I have independently ordered, reviewed and interpreted all labs and imaging: CBC: No leukocytosis, stable hemoglobin of 11.1 CMP, mild hypokalemia of 3.2 likely in the setting of diarrhea, hypocalcemia of 7.5, no other significant electrolyte derangements, normal renal and liver function. Lipase:WNL Lactic acid: WNL Hemoccult: Positive UA: Many bacteria present with some RBCs, patient without any current urinary symptoms, will send for culture.  C. difficile and GI pathogen panels are pending  CT abdomen pelvis shows pancolitis, no evidence of bowel obstruction, normal appendix, uterine fibroids  noted.  Given patient's persistent diarrhea feel that she is not keeping up with her fluid bolus as well, will continue to give maintenance IV fluids.  Given fevers and blood in stool high concern for either C. difficile or potentially E. coli or Shigella causing pancolitis.  Given the patient is currently hemodynamically stable feel it is reasonable to wait for C. difficile testing prior to antibiotic therapy initiation but feel patient will require admission given persistent and worsening symptoms.  Please discussed with Dr. Barrington Ellison with [facility] hospitalist who accepts patient for transfer and admission to [facility].  Final Clinical Impression(s) / ED Diagnoses Final diagnoses:  Pancolitis [facility])    Rx / DC Orders ED Discharge Orders    None       Janet Berlin 01/15/20 1212    Malvin Johns, MD 01/19/20 1504

## 2020-01-15 ENCOUNTER — Encounter (HOSPITAL_COMMUNITY): Payer: Self-pay | Admitting: Internal Medicine

## 2020-01-15 DIAGNOSIS — J4520 Mild intermittent asthma, uncomplicated: Secondary | ICD-10-CM | POA: Diagnosis present

## 2020-01-15 DIAGNOSIS — K5100 Ulcerative (chronic) pancolitis without complications: Secondary | ICD-10-CM

## 2020-01-15 DIAGNOSIS — E876 Hypokalemia: Secondary | ICD-10-CM | POA: Diagnosis present

## 2020-01-15 DIAGNOSIS — A09 Infectious gastroenteritis and colitis, unspecified: Secondary | ICD-10-CM | POA: Diagnosis present

## 2020-01-15 DIAGNOSIS — K219 Gastro-esophageal reflux disease without esophagitis: Secondary | ICD-10-CM | POA: Diagnosis present

## 2020-01-15 DIAGNOSIS — R933 Abnormal findings on diagnostic imaging of other parts of digestive tract: Secondary | ICD-10-CM

## 2020-01-15 LAB — MAGNESIUM: Magnesium: 1.8 mg/dL (ref 1.7–2.4)

## 2020-01-15 LAB — CBC WITH DIFFERENTIAL/PLATELET
Abs Immature Granulocytes: 0 10*3/uL (ref 0.00–0.07)
Basophils Absolute: 0 10*3/uL (ref 0.0–0.1)
Basophils Relative: 1 %
Eosinophils Absolute: 0.2 10*3/uL (ref 0.0–0.5)
Eosinophils Relative: 5 %
HCT: 35.1 % — ABNORMAL LOW (ref 36.0–46.0)
Hemoglobin: 11.5 g/dL — ABNORMAL LOW (ref 12.0–15.0)
Lymphocytes Relative: 41 %
Lymphs Abs: 1.8 10*3/uL (ref 0.7–4.0)
MCH: 30.3 pg (ref 26.0–34.0)
MCHC: 32.8 g/dL (ref 30.0–36.0)
MCV: 92.6 fL (ref 80.0–100.0)
Monocytes Absolute: 0.7 10*3/uL (ref 0.1–1.0)
Monocytes Relative: 16 %
Neutro Abs: 1.7 10*3/uL (ref 1.7–7.7)
Neutrophils Relative %: 37 %
Platelets: 237 10*3/uL (ref 150–400)
RBC: 3.79 MIL/uL — ABNORMAL LOW (ref 3.87–5.11)
RDW: 13.7 % (ref 11.5–15.5)
WBC: 4.5 10*3/uL (ref 4.0–10.5)
nRBC: 0 % (ref 0.0–0.2)
nRBC: 1 /100 WBC — ABNORMAL HIGH

## 2020-01-15 LAB — GASTROINTESTINAL PANEL BY PCR, STOOL (REPLACES STOOL CULTURE)

## 2020-01-15 LAB — COMPREHENSIVE METABOLIC PANEL
ALT: 17 U/L (ref 0–44)
AST: 17 U/L (ref 15–41)
Albumin: 2.7 g/dL — ABNORMAL LOW (ref 3.5–5.0)
Alkaline Phosphatase: 41 U/L (ref 38–126)
Anion gap: 10 (ref 5–15)
BUN: <5 mg/dL — ABNORMAL LOW (ref 6–20)
CO2: 25 mmol/L (ref 22–32)
Calcium: 7.8 mg/dL — ABNORMAL LOW (ref 8.9–10.3)
Chloride: 103 mmol/L (ref 98–111)
Creatinine, Ser: 0.5 mg/dL (ref 0.44–1.00)
GFR calc Af Amer: >60 mL/min (ref >60)
GFR calc non Af Amer: >60 mL/min (ref >60)
Glucose, Bld: 102 mg/dL — ABNORMAL HIGH (ref 70–99)
Potassium: 3.5 mmol/L (ref 3.5–5.1)
Sodium: 138 mmol/L (ref 135–145)
Total Bilirubin: 0.1 mg/dL — ABNORMAL LOW (ref 0.3–1.2)
Total Protein: 6.4 g/dL — ABNORMAL LOW (ref 6.5–8.1)

## 2020-01-15 LAB — C DIFFICILE QUICK SCREEN W PCR REFLEX
C Diff antigen: NEGATIVE
C Diff interpretation: NOT DETECTED
C Diff toxin: NEGATIVE

## 2020-01-15 LAB — C-REACTIVE PROTEIN: CRP: 9.2 mg/dL — ABNORMAL HIGH (ref <1.0)

## 2020-01-15 LAB — SEDIMENTATION RATE: Sed Rate: 50 mm/hr — ABNORMAL HIGH (ref 0–22)

## 2020-01-15 LAB — HIV ANTIBODY (ROUTINE TESTING W REFLEX): HIV Screen 4th Generation wRfx: NONREACTIVE

## 2020-01-15 LAB — FERRITIN: Ferritin: 35 ng/mL (ref 11–307)

## 2020-01-15 LAB — SARS CORONAVIRUS 2 BY RT PCR (HOSPITAL ORDER, PERFORMED IN ~~LOC~~ HOSPITAL LAB): SARS Coronavirus 2: NEGATIVE

## 2020-01-15 MED ORDER — BOOST / RESOURCE BREEZE PO LIQD CUSTOM
1.0000 | Freq: Three times a day (TID) | ORAL | Status: DC
  Administered 2020-01-15 – 2020-01-16 (×4): 1 via ORAL

## 2020-01-15 MED ORDER — SODIUM CHLORIDE 0.9 % IV SOLN
500.0000 mg | INTRAVENOUS | Status: DC
  Administered 2020-01-15 – 2020-01-16 (×2): 500 mg via INTRAVENOUS
  Filled 2020-01-15 (×2): qty 500

## 2020-01-15 MED ORDER — ACETAMINOPHEN 325 MG PO TABS
650.0000 mg | ORAL_TABLET | Freq: Four times a day (QID) | ORAL | Status: DC | PRN
  Administered 2020-01-15: 650 mg via ORAL
  Filled 2020-01-15 (×2): qty 2

## 2020-01-15 MED ORDER — ADULT MULTIVITAMIN W/MINERALS CH
1.0000 | ORAL_TABLET | Freq: Every day | ORAL | Status: DC
  Administered 2020-01-15 – 2020-01-16 (×2): 1 via ORAL
  Filled 2020-01-15 (×2): qty 1

## 2020-01-15 MED ORDER — POTASSIUM CHLORIDE IN NACL 20-0.9 MEQ/L-% IV SOLN
INTRAVENOUS | Status: AC
  Filled 2020-01-15 (×2): qty 1000

## 2020-01-15 MED ORDER — ACETAMINOPHEN 650 MG RE SUPP: 650.0000 mg | Freq: Four times a day (QID) | RECTAL | Status: DC | PRN

## 2020-01-15 MED ORDER — ALBUTEROL SULFATE (2.5 MG/3ML) 0.083% IN NEBU: 2.5000 mg | INHALATION_SOLUTION | RESPIRATORY_TRACT | Status: DC | PRN

## 2020-01-15 MED ORDER — ENOXAPARIN SODIUM 40 MG/0.4ML ~~LOC~~ SOLN
40.0000 mg | SUBCUTANEOUS | Status: DC
  Administered 2020-01-15: 40 mg via SUBCUTANEOUS
  Filled 2020-01-15 (×2): qty 0.4

## 2020-01-15 MED ORDER — PANTOPRAZOLE SODIUM 40 MG IV SOLR
40.0000 mg | INTRAVENOUS | Status: DC
  Administered 2020-01-15 – 2020-01-16 (×2): 40 mg via INTRAVENOUS
  Filled 2020-01-15 (×2): qty 40

## 2020-01-15 MED ORDER — POTASSIUM CHLORIDE CRYS ER 20 MEQ PO TBCR
40.0000 meq | EXTENDED_RELEASE_TABLET | Freq: Once | ORAL | Status: AC
  Administered 2020-01-15: 40 meq via ORAL
  Filled 2020-01-15: qty 2

## 2020-01-15 MED ORDER — ONDANSETRON HCL 4 MG PO TABS: 4.0000 mg | ORAL_TABLET | Freq: Four times a day (QID) | ORAL | Status: DC | PRN

## 2020-01-15 MED ORDER — MORPHINE SULFATE (PF) 2 MG/ML IV SOLN
2.0000 mg | INTRAVENOUS | Status: DC | PRN
  Filled 2020-01-15: qty 1

## 2020-01-15 MED ORDER — OXYCODONE-ACETAMINOPHEN 5-325 MG PO TABS
1.0000 | ORAL_TABLET | ORAL | Status: DC | PRN
  Filled 2020-01-15 (×2): qty 1

## 2020-01-15 MED ORDER — ONDANSETRON HCL 4 MG/2ML IJ SOLN: 4.0000 mg | Freq: Four times a day (QID) | INTRAMUSCULAR | Status: DC | PRN

## 2020-01-15 NOTE — Consult Note (Signed)
Consultation  Referring Provider: Dr. Erlinda Hong    Primary Care Physician:  Default, Provider, MD Primary Gastroenterologist: Althia Forts        Reason for Consultation: Pancolitis            HPI:   Loretta Carroll is a 54 y.o. female with a past medical history of asthma, migraine headaches, IBS and others, who presented to the ER on 01/14/2020 with a complaint of abdominal pain and diarrhea.    Today, the patient is Spanish-speaking, so an interpreter was used, the patient explains that she recently returned from a trip to [place] last weekend and within 36 hours of arriving home she began experiencing headaches and watery diarrhea.  Initially the diarrhea was nonbloody but occurred frequently in excess of 15 times a day and was associated with subjective fevers.  Also had some lower abdominal cramping pain rated as a 6/10 which waxes and wanes in quality and is nonradiating.  Her symptoms continued throughout the week and she tried a few doses of Amoxicillin at home from a supply she had, but despite taking the medicine her symptoms persisted.      Went to [facility] urgent care for evaluation on Friday 5/28 and while being evaluated there had a fever of 100.5, given IV fluids and basic labs done but was discharged home without a radiographic work-up.      Symptoms worsened and she began to experience some blood mixed with her diarrhea, bright red in color as well as generalized weakness and lightheadedness along with poor appetite.  She presented to [facility] for evaluation where CT of the abdomen revealed pancolitis and she was found to be hypokalemic with a potassium of 3.2 and a mild anemia of 11.3 and clinically felt to be dehydrated.  Stool samples collected for culture and C. difficile testing.  IV fluids initiated.  She was transferred to [facility].    Currently, the patient tells me that she is up and down every few minutes anytime that she takes a sip of anything it just comes out  of her and even but in between.  She continues with a lower abdominal pain and is slightly dizzy though this feels somewhat better after her IV fluids.  Denies any nausea or vomiting.  Describes a history of IBS but typically this is with constipation, so the diarrhea is a new thing for her.    Denies heartburn or reflux.  GI history: Describes a colonoscopy 3 years ago [employer] was told she had a "megacolon" and some internal hemorrhoids.  We do not have report.  Past Medical History:  Diagnosis Date  . Asthma   . Hypertension   . Migraine     History reviewed. No pertinent surgical history.  Family History: No GI cancers  Social History   Tobacco Use  . Smoking status: Never Smoker  . Smokeless tobacco: Never Used  Substance Use Topics  . Alcohol use: No  . Drug use: No    Prior to Admission medications   Medication Sig Start Date End Date Taking? Authorizing Provider  albuterol (PROVENTIL HFA;VENTOLIN HFA) 108 (90 BASE) MCG/ACT inhaler Inhale 2 puffs into the lungs every 6 (six) hours as needed for shortness of breath.   Yes [provider]  albuterol (PROVENTIL) (2.5 MG/3ML) 0.083% nebulizer solution Take 2.5 mg by nebulization every 6 (six) hours as needed for wheezing or shortness of breath.   Yes [provider]  atenolol (TENORMIN) 25 MG tablet Take 25 mg by mouth daily. 06/30/19  Yes [provider]  budesonide (PULMICORT) 180 MCG/ACT inhaler Inhale 1 puff into the lungs every morning.   Yes [provider]  cetirizine (ZYRTEC) 10 MG tablet Take 10 mg by mouth daily. 12/20/19  Yes [provider]  gabapentin (NEURONTIN) 600 MG tablet Take 600 mg by mouth at bedtime as needed (muscle/back pain).  12/19/19  Yes [provider]  iron polysaccharides (NIFEREX) 150 MG capsule Take 150 mg by mouth every Monday, Wednesday, and Friday. 12/14/19  Yes [provider]  montelukast (SINGULAIR) 10 MG tablet  Take 10 mg by mouth at bedtime.   Yes [provider]  omeprazole (PRILOSEC) 40 MG capsule Take 40 mg by mouth daily as needed (reflux).    Yes [provider]  SUMAtriptan (IMITREX) 100 MG tablet Take 100 mg by mouth every 2 (two) hours as needed for migraine or headache. May repeat in 2 hours if headache persists or recurs.   Yes [provider]  methocarbamol (ROBAXIN) 500 MG tablet Take 1 tablet (500 mg total) by mouth 2 (two) times daily. Patient not taking: Reported on 01/15/2020 03/11/18   Ashley Murrain, NP  promethazine (PHENERGAN) 25 MG tablet Take 1 tablet (25 mg total) by mouth every 6 (six) hours as needed for nausea or vomiting. Patient not taking: Reported on 01/15/2020 03/11/18   Ashley Murrain, NP    Current Facility-Administered Medications  Medication Dose Route Frequency Provider Last Rate Last Admin  . 0.9 % NaCl with KCl 20 mEq/ L  infusion   Intravenous Continuous Shalhoub, Sherryll Burger, MD 125 mL/hr at 01/15/20 1333 New Bag at 01/15/20 1333  . acetaminophen (TYLENOL) tablet 650 mg  650 mg Oral Q6H PRN Shalhoub, Sherryll Burger, MD       Or  . acetaminophen (TYLENOL) suppository 650 mg  650 mg Rectal Q6H PRN Shalhoub, Sherryll Burger, MD      . albuterol (PROVENTIL) (2.5 MG/3ML) 0.083% nebulizer solution 2.5 mg  2.5 mg Inhalation Q4H PRN Shalhoub, Sherryll Burger, MD      . azithromycin (ZITHROMAX) 500 mg in sodium chloride 0.9 % 250 mL IVPB  500 mg Intravenous Q24H Vernelle Emerald, MD 250 mL/hr at 01/15/20 0540 500 mg at 01/15/20 0540  . enoxaparin (LOVENOX) injection 40 mg  40 mg Subcutaneous Q24H Shalhoub, Sherryll Burger, MD   40 mg at 01/15/20 0541  . feeding supplement (BOOST / RESOURCE BREEZE) liquid 1 Container  1 Container Oral TID BM Shalhoub, Sherryll Burger, MD   1 Container at 01/15/20 1212  . oxyCODONE-acetaminophen (PERCOCET/ROXICET) 5-325 MG per tablet 1 tablet  1 tablet Oral Q4H PRN Shalhoub, Sherryll Burger, MD       Or  . morphine 2 MG/ML injection 2 mg  2 mg Intravenous Q4H  PRN Shalhoub, Sherryll Burger, MD      . morphine 4 MG/ML injection 4 mg  4 mg Intravenous Once Shalhoub, Sherryll Burger, MD      . multivitamin with minerals tablet 1 tablet  1 tablet Oral Daily Florencia Reasons, MD   1 tablet at 01/15/20 1212  . ondansetron (ZOFRAN) injection 4 mg  4 mg Intravenous Once Shalhoub, Sherryll Burger, MD      . ondansetron [facility]) tablet 4 mg  4 mg Oral Q6H PRN Shalhoub, Sherryll Burger, MD       Or  . ondansetron [facility]) injection 4 mg  4 mg Intravenous Q6H PRN  Vernelle Emerald, MD      . pantoprazole (PROTONIX) injection 40 mg  40 mg Intravenous Q24H Vernelle Emerald, MD   40 mg at 01/15/20 0531    Allergies as of 01/14/2020 - Review Complete 01/14/2020  Allergen Reaction Noted  . Shellfish allergy Anaphylaxis 10/25/2012     Review of Systems:    Constitutional: See HPI Skin: No rash  Cardiovascular: No chest pain  Respiratory: No SOB  Gastrointestinal: See HPI and otherwise negative Genitourinary: No dysuria  Neurological: See HPI Musculoskeletal: No new muscle or joint pain Hematologic: No bruising Psychiatric: No history of depression or anxiety    Physical Exam:  Vital signs in last 24 hours: Temp:  [97.9 F (36.6 C)-100.7 F (38.2 C)] 97.9 F (36.6 C) (05/30 1049) Pulse Rate:  [82-95] 92 (05/30 1049) Resp:  [12-24] 17 (05/30 1049) BP: (115-132)/(52-80) 115/69 (05/30 1049) SpO2:  [98 %-100 %] 100 % (05/30 1049) Weight:  [88.9 kg-89.4 kg] 89.4 kg (05/30 0234) Last BM Date: 01/15/20 General:   Pleasant Hispanic female appears to be in NAD, Well developed, Well nourished, alert and cooperative Head:  Normocephalic and atraumatic. Eyes:   PEERL, EOMI. No icterus. Conjunctiva pink. Ears:  Normal auditory acuity. Neck:  Supple Throat: Oral cavity and pharynx without inflammation, swelling or lesion. Teeth in good condition. Lungs: Respirations even and unlabored. Lungs clear to auscultation bilaterally.   No wheezes, crackles, or rhonchi.  Heart: Normal S1, S2. No  MRG. Regular rate and rhythm. No peripheral edema, cyanosis or pallor.  Abdomen:  Soft, nondistended, Moderate generalized ttp with involuntary guarding, Increased BS all four quadrants, No appreciable masses or hepatomegaly. Rectal:  Not performed.  Msk:  Symmetrical without gross deformities. Peripheral pulses intact.  Extremities:  Without edema, no deformity or joint abnormality. Neurologic:  Alert and  oriented x4;  grossly normal neurologically.   Skin:   Dry and intact without significant lesions or rashes. Psychiatric: Demonstrates good judgement and reason without abnormal affect or behaviors.  LAB RESULTS: Recent Labs    01/14/20 1939 01/15/20 0317  WBC 4.3 4.5  HGB 11.3* 11.5*  HCT 34.8* 35.1*  PLT 230 237   BMET Recent Labs    01/14/20 1939 01/15/20 0317  NA 137 138  K 3.2* 3.5  CL 103 103  CO2 26 25  GLUCOSE 96 102*  BUN 6 <5*  CREATININE 0.51 0.50  CALCIUM 7.5* 7.8*   LFT Recent Labs    01/15/20 0317  PROT 6.4*  ALBUMIN 2.7*  AST 17  ALT 17  ALKPHOS 41  BILITOT 0.1*   STUDIES: CT Abdomen Pelvis W Contrast  Result Date: 01/14/2020 CLINICAL DATA:  54 year old female with abdominal pain and diarrhea. EXAM: CT ABDOMEN AND PELVIS WITH CONTRAST TECHNIQUE: Multidetector CT imaging of the abdomen and pelvis was performed using the standard protocol following bolus administration of intravenous contrast. CONTRAST:  126m OMNIPAQUE IOHEXOL 300 MG/ML  SOLN COMPARISON:  None. FINDINGS: Lower chest: The visualized lung bases are clear. No intra-abdominal free air or free fluid. Hepatobiliary: There are remarkable. No intrahepatic biliary ductal dilatation. Cholecystectomy. No retained calcified stone noted in the central CBD. Pancreas: Unremarkable. No pancreatic ductal dilatation or surrounding inflammatory changes. Spleen: Normal in size without focal abnormality. Adrenals/Urinary Tract: The adrenal glands are unremarkable. There is no hydronephrosis on either side.  There is symmetric enhancement and excretion of contrast by both kidneys. Subcentimeter right renal hypodense focus is too small to characterize. The visualized ureters appear unremarkable. The  urinary bladder is collapsed. Stomach/Bowel: Small hiatal hernia. There is diffuse thickening and inflammatory changes of the colon consistent with colitis. There is no bowel obstruction. The appendix is normal. Vascular/Lymphatic: The abdominal aorta and IVC unremarkable. No portal venous gas. There is no adenopathy. Reproductive: The uterus is anteverted. There is a 5 cm posterior fundal fibroid. Additional right body fibroid noted. There is a 3 cm left ovarian dominant follicle or corpus luteum. Other: None Musculoskeletal: No acute or significant osseous findings. IMPRESSION: 1. Pancolitis. Correlation with clinical exam and stool cultures recommended. No bowel obstruction. Normal appendix. 2. Uterine fibroids. Electronically Signed   By: Anner Crete M.D.   On: 01/14/2020 21:22    Impression / Plan:   Impression: 1.  Diarrhea: 6-day history of severe diarrhea, intermittently bloody with pancolitis on CT as above, after recent travel to [place], GI panel and C. difficile are pending; consider infectious cause versus inflammatory versus parasitic versus other 2.  Pancolitis 3.  GERD 4.  Hypokalemia 5.  Normocytic anemia: Hemoglobin 11.5 (from 12.8 on 01/13/2020); consider relation to recent hematochezia  Plan: 1.  Continue Protonix for reflux 2.  Agree with Azithromycin for now 3.  Stool studies pending including GI panel and C. difficile, added on and O&P as well as inflammatory markers ESR and CRP and ferritin 4.  Agree with replacement of potassium 5.  Pending results from above may consider endoscopic evaluation while patient is here 6.  Okay with clear liquids. 7.  Please await any further recommendations from Dr. Rush Landmark later today  Thank you for your kind consultation, we will continue  to follow.  Lavone Nian Cierra Rothgeb  01/15/2020, 1:53 PM

## 2020-01-15 NOTE — Progress Notes (Signed)
MD notified of GI panel result  Yvonna Alanis

## 2020-01-15 NOTE — Progress Notes (Signed)
Initial Nutrition Assessment  RD working remotely.  DOCUMENTATION CODES:   Obesity unspecified  INTERVENTION:   -Continue Boost Breeze po TID, each supplement provides 250 kcal and 9 grams of protein -MVI with minerals daily -RD will follow for diet advancement and adjust supplement regimen as appropriate  NUTRITION DIAGNOSIS:   Inadequate oral intake related to altered GI function, diarrhea as evidenced by other (comment)(clear liquid diet).  GOAL:   Patient will meet greater than or equal to 90% of their needs  MONITOR:   PO intake, Supplement acceptance, Diet advancement, Labs, Weight trends, Skin, I & O's  REASON FOR ASSESSMENT:   Malnutrition Screening Tool    ASSESSMENT:   54 year old female with past medical history of asthma, migraine headaches, irritable bowel syndrome who presents to [facility] emergency department and then is transferred to medical surgical unit of [facility] with complaints of diarrhea.  Pt admitted with acute infectious diarrhea.   Attempted to speak with pt via phone, however, no answer.   Per chart review, pt has had persistent diarrhea over the past week, after returning from a trip to [place] last weekend.   Pt currently on a clear liquid diet; no meal completion data to assess at this time.   Reviewed wt hx; wt has been stable over the past 2 years.   Medications reviewed and include zofran, zithromax, and 0.9% NaCl with KCl 20 MEq/L infusion @ 125 ml/hr.   Labs reviewed.   Diet Order:   Diet Order            Diet clear liquid Room service appropriate? Yes; Fluid consistency: Thin  Diet effective now              EDUCATION NEEDS:   No education needs have been identified at this time  Skin:  Skin Assessment: Reviewed RN Assessment  Last BM:  01/15/20  Height:   Ht Readings from Last 1 Encounters:  01/15/20 5' 2"  (1.575 m)    Weight:   Wt Readings from Last 1 Encounters:  01/15/20 89.4  kg    Ideal Body Weight:  50 kg  BMI:  Body mass index is 36.05 kg/m.  Estimated Nutritional Needs:   Kcal:  1500-1700  Protein:  85-100 grams  Fluid:  > 1.5 L    Loistine Chance, RD, LDN, [place] Registered Dietitian II Certified Diabetes Care and Education Specialist Please refer to [facility] for RD and/or RD on-call/weekend/after hours pager

## 2020-01-15 NOTE — Progress Notes (Signed)
Patient admitted early this morning due to diarrhea with  Recent travel history, she currently denies pain, no fever.  CT showed pancolitis, GI PCR panel positive for Campylobacter, continue Zithromax.  GI consulted. Family updated at bedside.

## 2020-01-15 NOTE — H&P (Signed)
History and Physical    Loretta Carroll IWL:[id number] DOB: [birth year] DOA: 01/14/2020  PCP: Default, Provider, MD  Patient coming from: Transfer from [facility] ER   Chief Complaint:  Chief Complaint  Patient presents with  . Abdominal Pain     HPI:  54 year old female with past medical history of asthma, migraine headaches, irritable bowel syndrome who presents to [place] emergency department and then is transferred to medical surgical unit of [facility] with complaints of diarrhea.  Patient explains that she recently returned from a trip to [place] last weekend.  Within 36 hours of arriving home, patient began to experience headaches and watery diarrhea.  Watery diarrhea initially was nonbloody but occurred frequently, in excess of 15 times daily.  This was associated with subjective fevers.  Patient also complains of associated lower abdominal cramping pain, 6 out of 10 in intensity, waxing and waning in quality and nonradiating.  Patient's symptoms continue to persist throughout the week.  In an attempt to treat herself, patient took a few doses of amoxicillin from a home supply she still had from her recent bout of strep throat.  Patient states that despite taking amoxicillin her symptoms persisted throughout the week.  Patient presented to [facility] for evaluation on Friday, 5/28.  Patient explains that while she was being evaluated there she had a fever of 100.5.  Patient was given intravenous fluids and had basic lab work done but was discharged home without a radiographic work-up.  Patient states that since being sent home from the emergency department her symptoms continue to worsen.  She then began to experience some blood mixed with her diarrhea.  Blood was notably in the toilet and bright red in color.  Patient began to also experience generalized weakness and lightheadedness along with poor appetite associated with her ongoing symptoms.  Due to patient's  worsening symptoms she presented to [place] emergency department for evaluation where CT imaging of the abdomen revealed pancolitis.  Patient was also found to be hypokalemic with a potassium of 3.2 with a mild anemia of 11.3 and was clinically felt to be dehydrated.  Stool samples were collected for stool culture and C.  Difficile testing.  Intravenous fluids were initiated.  The hospitalist group was then called to assess the patient for transfer to [facility] for further management.   Review of Systems: A 10-system review of systems has been performed and all systems are negative with the exception of what is listed in the HPI.    Past Medical History:  Diagnosis Date  . Asthma   . Hypertension   . Migraine     History reviewed. No pertinent surgical history.   reports that she has never smoked. She has never used smokeless tobacco. She reports that she does not drink alcohol or use drugs.  Allergies  Allergen Reactions  . Shellfish Allergy Anaphylaxis    History reviewed. No pertinent family history.   Prior to Admission medications   Medication Sig Start Date End Date Taking? Authorizing Provider  albuterol (PROVENTIL HFA;VENTOLIN HFA) 108 (90 BASE) MCG/ACT inhaler Inhale 2 puffs into the lungs every 6 (six) hours as needed for shortness of breath.    [provider]  albuterol (PROVENTIL) (2.5 MG/3ML) 0.083% nebulizer solution Take 2.5 mg by nebulization every 6 (six) hours as needed for wheezing or shortness of breath.    [provider]  budesonide (PULMICORT) 180 MCG/ACT inhaler Inhale 1 puff into the  lungs every morning.    [provider]  methocarbamol (ROBAXIN) 500 MG tablet Take 1 tablet (500 mg total) by mouth 2 (two) times daily. 03/11/18   Ashley Murrain, NP  montelukast (SINGULAIR) 10 MG tablet Take 10 mg by mouth at bedtime.    [provider]  omeprazole (PRILOSEC) 40 MG capsule Take 40 mg by mouth daily.     [provider]  promethazine (PHENERGAN) 25 MG tablet Take 1 tablet (25 mg total) by mouth every 6 (six) hours as needed for nausea or vomiting. 03/11/18   Ashley Murrain, NP  ranitidine (ZANTAC) 150 MG tablet Take 150 mg by mouth 2 (two) times daily.    [provider]  simethicone (MYLICON) 80 MG chewable tablet Chew 80 mg by mouth every 6 (six) hours as needed for flatulence.    [provider]  SUMAtriptan (IMITREX) 100 MG tablet Take 100 mg by mouth every 2 (two) hours as needed for migraine or headache. May repeat in 2 hours if headache persists or recurs.    [provider]    Physical Exam: Vitals:   01/14/20 2116 01/14/20 2318 01/15/20 0120 01/15/20 0234  BP: 121/75 132/74  120/74  Pulse: 95 94  87  Resp: (!) 24 12  (!) 22  Temp:   98.8 F (37.1 C) 98.9 F (37.2 C)  TempSrc:   Oral Oral  SpO2: 100% 99%  99%  Weight:    89.4 kg  Height:    5' 2"  (1.575 m)    Constitutional: Acute alert and oriented x3, no associated distress.   Skin: no rashes, no lesions, notably poor skin turgor Eyes: Pupils are equally reactive to light.  No evidence of scleral icterus or conjunctival pallor.  ENMT: Moist mucous membranes noted.  Posterior pharynx clear of any exudate or lesions.   Neck: normal, supple, no masses, no thyromegaly.  No evidence of jugular venous distension.   Respiratory: clear to auscultation bilaterally, no wheezing, no crackles. Normal respiratory effort. No accessory muscle use.  Cardiovascular: Regular rate and rhythm, no murmurs / rubs / gallops. No extremity edema. 2+ pedal pulses. No carotid bruits.  Chest:   Nontender without crepitus or deformity.   Back:   Nontender without crepitus or deformity. Abdomen: Diffuse abdominal tenderness.  Abdomen is soft however.  Slightly hyperactive bowel sounds.  No evidence of intra-abdominal masses.  Musculoskeletal: No joint deformity upper and lower extremities. Good ROM, no contractures.  Normal muscle tone.  Neurologic: CN 2-12 grossly intact. Sensation intact, strength noted to be 5 out of 5 in all 4 extremities.  Patient is following all commands.  Patient is responsive to verbal stimuli.   Psychiatric: Patient presents as a normal mood with appropriate affect.  Patient seems to possess insight as to theircurrent situation.     Labs on Admission: I have personally reviewed following labs and imaging studies -   CBC: Recent Labs  Lab 01/14/20 1939 01/15/20 0317  WBC 4.3 4.5  NEUTROABS 1.5* 1.7  HGB 11.3* 11.5*  HCT 34.8* 35.1*  MCV 92.3 92.6  PLT 230 947   Basic Metabolic Panel: Recent Labs  Lab 01/14/20 1939 01/15/20 0317  NA 137 138  K 3.2* 3.5  CL 103 103  CO2 26 25  GLUCOSE 96 102*  BUN 6 <5*  CREATININE 0.51 0.50  CALCIUM 7.5* 7.8*  MG  --  1.8   GFR: Estimated Creatinine Clearance: 85.4 mL/min (by C-G formula based on SCr  of 0.5 mg/dL). Liver Function Tests: Recent Labs  Lab 01/14/20 1939 01/15/20 0317  AST 18 17  ALT 16 17  ALKPHOS 38 41  BILITOT 0.2* 0.1*  PROT 6.8 6.4*  ALBUMIN 3.0* 2.7*   Recent Labs  Lab 01/14/20 1939  LIPASE 25   No results for input(s): AMMONIA in the last 168 hours. Coagulation Profile: No results for input(s): INR, PROTIME in the last 168 hours. Cardiac Enzymes: No results for input(s): CKTOTAL, CKMB, CKMBINDEX, TROPONINI in the last 168 hours. BNP (last 3 results) No results for input(s): PROBNP in the last 8760 hours. HbA1C: No results for input(s): HGBA1C in the last 72 hours. CBG: No results for input(s): GLUCAP in the last 168 hours. Lipid Profile: No results for input(s): CHOL, HDL, LDLCALC, TRIG, CHOLHDL, LDLDIRECT in the last 72 hours. Thyroid Function Tests: No results for input(s): TSH, T4TOTAL, FREET4, T3FREE, THYROIDAB in the last 72 hours. Anemia Panel: No results for input(s): VITAMINB12, FOLATE, FERRITIN, TIBC, IRON, RETICCTPCT in the last 72 hours. Urine analysis:    Component  Value Date/Time   COLORURINE YELLOW 01/14/2020 1939   APPEARANCEUR HAZY (A) 01/14/2020 1939   LABSPEC 1.025 01/14/2020 1939   PHURINE 6.0 01/14/2020 1939   GLUCOSEU NEGATIVE 01/14/2020 1939   HGBUR LARGE (A) 01/14/2020 1939   BILIRUBINUR NEGATIVE 01/14/2020 1939   KETONESUR NEGATIVE 01/14/2020 1939   PROTEINUR NEGATIVE 01/14/2020 1939   NITRITE NEGATIVE 01/14/2020 1939   LEUKOCYTESUR NEGATIVE 01/14/2020 1939    Radiological Exams on Admission - Personally Reviewed: CT Abdomen Pelvis W Contrast  Result Date: 01/14/2020 CLINICAL DATA:  54 year old female with abdominal pain and diarrhea. EXAM: CT ABDOMEN AND PELVIS WITH CONTRAST TECHNIQUE: Multidetector CT imaging of the abdomen and pelvis was performed using the standard protocol following bolus administration of intravenous contrast. CONTRAST:  160m OMNIPAQUE IOHEXOL 300 MG/ML  SOLN COMPARISON:  None. FINDINGS: Lower chest: The visualized lung bases are clear. No intra-abdominal free air or free fluid. Hepatobiliary: There are remarkable. No intrahepatic biliary ductal dilatation. Cholecystectomy. No retained calcified stone noted in the central CBD. Pancreas: Unremarkable. No pancreatic ductal dilatation or surrounding inflammatory changes. Spleen: Normal in size without focal abnormality. Adrenals/Urinary Tract: The adrenal glands are unremarkable. There is no hydronephrosis on either side. There is symmetric enhancement and excretion of contrast by both kidneys. Subcentimeter right renal hypodense focus is too small to characterize. The visualized ureters appear unremarkable. The urinary bladder is collapsed. Stomach/Bowel: Small hiatal hernia. There is diffuse thickening and inflammatory changes of the colon consistent with colitis. There is no bowel obstruction. The appendix is normal. Vascular/Lymphatic: The abdominal aorta and IVC unremarkable. No portal venous gas. There is no adenopathy. Reproductive: The uterus is anteverted. There is a 5  cm posterior fundal fibroid. Additional right body fibroid noted. There is a 3 cm left ovarian dominant follicle or corpus luteum. Other: None Musculoskeletal: No acute or significant osseous findings. IMPRESSION: 1. Pancolitis. Correlation with clinical exam and stool cultures recommended. No bowel obstruction. Normal appendix. 2. Uterine fibroids. Electronically Signed   By: AAnner CreteM.D.   On: 01/14/2020 21:22      Assessment/Plan Principal Problem:   Acute infectious diarrhea   Patient presenting with a 6-day history of severe diarrhea, intermittently bloody with associated pancolitis on CT imaging of the abdomen  Presentation complicated by clinical hypovolemia and hypokalemia.  Considering recent travel to [place] traveler's diarrhea must be the primary consideration.  Second most likely etiology would be foodborne illness and  third possibility would be C. difficile colitis although this is seemingly unlikely.  Considering severe presentation, intravenous antibiotics are indicated.  Considering traveler's diarrhea is the most likely culprit we will start with intravenous azithromycin.  Stool GI panel as well as C. difficile testing are pending.  We will change or de-escalate antibiotic therapy based on these results.  Hydrate patient with intravenous isotonic fluids  Clear liquid diet for now until patient clinically improves.  Replacing associated electrolyte derangements such as hypokalemia with oral potassium chloride elixir  As needed analgesics for associated severe abdominal pain.  Active Problems:   Pancolitis [facility])   Considering pancolitis on CT imaging, if patient fails to improve after several days of therapy for suspected infectious process we will consider gastroenterology consultation.  See remainder of assessment and plan above.    GERD without esophagitis   Protonix daily    Mild intermittent asthma, uncomplicated   As needed  bronchodilator therapy for shortness of breath and wheezing    Hypokalemia due to excessive gastrointestinal loss of potassium   Replacing with oral potassium chloride  Monitoring potassium levels with serial chemistries      Code Status:  Full code Family Communication: Deferred  Status is: Inpatient  Remains inpatient appropriate because:Persistent severe electrolyte disturbances, Ongoing active pain requiring inpatient pain management, Ongoing diagnostic testing needed not appropriate for outpatient work up, IV treatments appropriate due to intensity of illness or inability to take PO and Inpatient level of care appropriate due to severity of illness   Dispo: The patient is from: Home              Anticipated d/c is to: Home              Anticipated d/c date is: 3 days              Patient currently is not medically stable to d/c.         Vernelle Emerald MD [facility] Pager [phone]  If 7PM-7AM, please contact night-coverage [email or web address] Use universal [place] password for that web site. If you do not have the password, please call the hospital operator.  01/15/2020, 4:47 AM

## 2020-01-16 ENCOUNTER — Encounter (HOSPITAL_COMMUNITY): Payer: Self-pay | Admitting: *Deleted

## 2020-01-16 DIAGNOSIS — E876 Hypokalemia: Secondary | ICD-10-CM

## 2020-01-16 DIAGNOSIS — K219 Gastro-esophageal reflux disease without esophagitis: Secondary | ICD-10-CM

## 2020-01-16 DIAGNOSIS — J4520 Mild intermittent asthma, uncomplicated: Secondary | ICD-10-CM

## 2020-01-16 DIAGNOSIS — A045 Campylobacter enteritis: Principal | ICD-10-CM

## 2020-01-16 LAB — CBC
HCT: 33.8 % — ABNORMAL LOW (ref 36.0–46.0)
Hemoglobin: 10.7 g/dL — ABNORMAL LOW (ref 12.0–15.0)
MCH: 29.6 pg (ref 26.0–34.0)
MCHC: 31.7 g/dL (ref 30.0–36.0)
MCV: 93.6 fL (ref 80.0–100.0)
Platelets: 255 10*3/uL (ref 150–400)
RBC: 3.61 MIL/uL — ABNORMAL LOW (ref 3.87–5.11)
RDW: 13.6 % (ref 11.5–15.5)
WBC: 3.8 10*3/uL — ABNORMAL LOW (ref 4.0–10.5)
nRBC: 0 % (ref 0.0–0.2)

## 2020-01-16 LAB — BASIC METABOLIC PANEL
Anion gap: 7 (ref 5–15)
BUN: <5 mg/dL — ABNORMAL LOW (ref 6–20)
CO2: 28 mmol/L (ref 22–32)
Calcium: 7.8 mg/dL — ABNORMAL LOW (ref 8.9–10.3)
Chloride: 106 mmol/L (ref 98–111)
Creatinine, Ser: 0.43 mg/dL — ABNORMAL LOW (ref 0.44–1.00)
GFR calc Af Amer: >60 mL/min (ref >60)
GFR calc non Af Amer: >60 mL/min (ref >60)
Glucose, Bld: 100 mg/dL — ABNORMAL HIGH (ref 70–99)
Potassium: 3.4 mmol/L — ABNORMAL LOW (ref 3.5–5.1)
Sodium: 141 mmol/L (ref 135–145)

## 2020-01-16 LAB — MAGNESIUM: Magnesium: 1.6 mg/dL — ABNORMAL LOW (ref 1.7–2.4)

## 2020-01-16 MED ORDER — POTASSIUM CHLORIDE 20 MEQ PO PACK
40.0000 meq | PACK | Freq: Once | ORAL | Status: AC
  Administered 2020-01-16: 40 meq via ORAL
  Filled 2020-01-16: qty 2

## 2020-01-16 MED ORDER — ONDANSETRON HCL 4 MG PO TABS: 4.0000 mg | ORAL_TABLET | Freq: Four times a day (QID) | ORAL | 0 refills | Status: AC | PRN

## 2020-01-16 MED ORDER — AZITHROMYCIN 500 MG PO TABS: 500.0000 mg | ORAL_TABLET | Freq: Every day | ORAL | 0 refills | Status: AC

## 2020-01-16 MED ORDER — SUMATRIPTAN SUCCINATE 6 MG/0.5ML ~~LOC~~ SOLN
6.0000 mg | SUBCUTANEOUS | Status: AC | PRN
  Administered 2020-01-16 (×2): 6 mg via SUBCUTANEOUS
  Filled 2020-01-16 (×3): qty 0.5

## 2020-01-16 MED ORDER — BUTALBITAL-APAP-CAFFEINE 50-325-40 MG PO TABS: 1.0000 | ORAL_TABLET | Freq: Four times a day (QID) | ORAL | 0 refills | Status: AC | PRN

## 2020-01-16 MED ORDER — AZITHROMYCIN 250 MG PO TABS
500.0000 mg | ORAL_TABLET | Freq: Every day | ORAL | Status: DC
  Administered 2020-01-16: 500 mg via ORAL
  Filled 2020-01-16: qty 2

## 2020-01-16 MED ORDER — TRAMADOL HCL 50 MG PO TABS
50.0000 mg | ORAL_TABLET | Freq: Once | ORAL | Status: AC
  Administered 2020-01-16: 50 mg via ORAL
  Filled 2020-01-16: qty 1

## 2020-01-16 MED ORDER — SODIUM CHLORIDE 0.9 % IV BOLUS
1000.0000 mL | Freq: Once | INTRAVENOUS | Status: AC
  Administered 2020-01-16: 1000 mL via INTRAVENOUS

## 2020-01-16 NOTE — Progress Notes (Signed)
Discharge instructions reviewed with pt and instructed on where to pick up prescriptions.  Pt verbalized understanding and questions answered.  Pt discharged in stable condition via wheelchair with husband.  Hector Shade [place]

## 2020-01-16 NOTE — Discharge Summary (Signed)
Physician Discharge Summary  Loretta Carroll GOT:[id number] DOB: Dec 24, 1965 DOA: 01/14/2020  PCP: Default, Provider, MD  Admit date: 01/14/2020 Discharge date: 01/16/2020 Consultations:  Gastroenterology Admitted From: home Disposition: home  Discharge Diagnoses:  Principal Problem:   Acute infectious diarrhea Active Problems:   Pancolitis ([place])   GERD without esophagitis   Mild intermittent asthma, uncomplicated   Hypokalemia due to excessive gastrointestinal loss of potassium   Hospital Course Summary: 54 y.o.spanish speaking femalewith a past medical history of asthma, migraine headaches, IBS and others, presented to the ER on 01/14/2020 with a complaint of abdominal pain and diarrhea.she recently returned from a trip to [place] last weekend and within 36 hours of arriving home she began experiencing headaches and watery diarrhea. Initially the diarrhea was nonbloody but occurred frequently in excess of 15 times a day and wasassociated with subjective fevers. Alsohad somelower abdominal cramping pain rated as a 6/10 which waxes and wanes in quality and is nonradiating. Her symptoms continued throughout the week and she tried a few doses of Amoxicillin at home from a supply she had, but despite taking the medicine her symptoms persisted.[facility] and while being evaluated there had a fever of 100.5, given IV fluids and basic labs done but was discharged home without a radiographic work-up.Symptoms worsened andshe began to experience some blood mixed with her diarrhea, bright red in color as well asgeneralized weakness and lightheadedness along with poor appetite.  ED Course: She presented to [place] for evaluation where CT of the abdomen revealed pancolitis and she was found to be hypokalemic with a potassium of 3.2 and a mild anemia of 11.3 and clinically felt to be dehydrated. Stool samples collected for culture and  C. difficile testing. IV fluids initiated. She was transferred to [facility]. Hospital course: Patient admitted to [facility] for further evaluation and management. C diff toxin negative, OCB was positive . Admitted with supportive management, azithromycin for traveller's diarrhea and GI consulted. Stool ova and parasite study sent-- resulted positive for Campylobacter. Already on 558m azithromycin /day. Advanced diet this morning as she reports improvement in frequency of diarrhea ( 1 BM very 6-8 hrs) . Supplemented potassium. HIV panel negative. D/W GI Dr MDorena Bodowho recommended discharge on Azithromycin x 7 day course with out patient follow up with PCP for follow up labs including CBC/BMP given risk of HUS (Hemolytic uremic syndrome) with campylobacter infection. Patient also advised to follow up with [facility] in 4-6 weeks for interval colonoscopy consideration. Patient denies any abdominal pain but reporting migraine headache. Prescribe Fioricet prn for home use (she has Imitrex at home-advised to watch frequency of use) . Labs this morning show mild hypokalemia-replaced before discharge.   Discharge Exam:  Vitals:   01/15/20 1622 01/16/20 0511  BP: 116/74 121/83  Pulse: 89 77  Resp: 17 17  Temp: 98.2 F (36.8 C) 97.9 F (36.6 C)  SpO2: 100% 99%   Vitals:   01/15/20 0544 01/15/20 1049 01/15/20 1622 01/16/20 0511  BP: 119/80 115/69 116/74 121/83  Pulse: 82 92 89 77  Resp: (!) 22 17 17 17   Temp: 98.8 F (37.1 C) 97.9 F (36.6 C) 98.2 F (36.8 C) 97.9 F (36.6 C)  TempSrc: Oral Axillary Oral Oral  SpO2: 100% 100% 100% 99%  Weight:      Height:        General: Pt is alert, awake, not in acute distress Cardiovascular: RRR, S1/S2 +, no rubs,  no gallops Respiratory: CTA bilaterally, no wheezing, no rhonchi Abdominal: Soft, NT, ND, bowel sounds + Extremities: no edema, no cyanosis  Discharge Condition:Stable CODE STATUS: Full code Diet recommendation: Recommendations for  Outpatient Follow-up:  1. Follow up with PCP: 1 week  2. Follow up with consultants: [facility] in 4-6 weeks 3. Please obtain follow up labs including: CBC/BMP, rule out HUS  Home Health services upon discharge: none Equipment/Devices upon discharge:none   Discharge Instructions:  Discharge Instructions    Call MD for:   Complete by: As directed    Abdominal pain, confusion, dizziness   Call MD for:  difficulty breathing, headache or visual disturbances   Complete by: As directed    Call MD for:  extreme fatigue   Complete by: As directed    Call MD for:  persistant dizziness or light-headedness   Complete by: As directed    Call MD for:  persistant nausea and vomiting   Complete by: As directed    Call MD for:  severe uncontrolled pain   Complete by: As directed    Call MD for:  temperature >100.4   Complete by: As directed    Diet - low sodium heart healthy   Complete by: As directed    Increase activity slowly   Complete by: As directed      Allergies as of 01/16/2020      Reactions   Shellfish Allergy Anaphylaxis      Medication List    STOP taking these medications   methocarbamol 500 MG tablet Commonly known as: ROBAXIN   promethazine 25 MG tablet Commonly known as: PHENERGAN     TAKE these medications   albuterol (2.5 MG/3ML) 0.083% nebulizer solution Commonly known as: PROVENTIL Take 2.5 mg by nebulization every 6 (six) hours as needed for wheezing or shortness of breath.   albuterol 108 (90 Base) MCG/ACT inhaler Commonly known as: VENTOLIN HFA Inhale 2 puffs into the lungs every 6 (six) hours as needed for shortness of breath.   atenolol 25 MG tablet Commonly known as: TENORMIN Take 25 mg by mouth daily.   azithromycin 500 MG tablet Commonly known as: ZITHROMAX Take 1 tablet (500 mg total) by mouth daily for 5 days. Take one tablet daily for 5 days Start taking on: January 17, 2020   budesonide 180 MCG/ACT inhaler Commonly known as: PULMICORT Inhale  1 puff into the lungs every morning.   butalbital-acetaminophen-caffeine 50-325-40 MG tablet Commonly known as: FIORICET Take 1 tablet by mouth every 6 (six) hours as needed for headache.   cetirizine 10 MG tablet Commonly known as: ZYRTEC Take 10 mg by mouth daily.   gabapentin 600 MG tablet Commonly known as: NEURONTIN Take 600 mg by mouth at bedtime as needed (muscle/back pain).   iron polysaccharides 150 MG capsule Commonly known as: NIFEREX Take 150 mg by mouth every Monday, Wednesday, and Friday.   montelukast 10 MG tablet Commonly known as: SINGULAIR Take 10 mg by mouth at bedtime.   omeprazole 40 MG capsule Commonly known as: PRILOSEC Take 40 mg by mouth daily as needed (reflux).   ondansetron 4 MG tablet Commonly known as: ZOFRAN Take 1 tablet (4 mg total) by mouth every 6 (six) hours as needed for nausea.   SUMAtriptan 100 MG tablet Commonly known as: IMITREX Take 100 mg by mouth every 2 (two) hours as needed for migraine or headache. May repeat in 2 hours if headache persists or recurs.       Allergies  Allergen Reactions  . Shellfish Allergy Anaphylaxis      The results of significant diagnostics from this hospitalization (including imaging, microbiology, ancillary and laboratory) are listed below for reference.    Labs: BNP (last 3 results) No results for input(s): BNP in the last 8760 hours. Basic Metabolic Panel: Recent Labs  Lab 01/14/20 1939 01/15/20 0317 01/16/20 0123  NA 137 138 141  K 3.2* 3.5 3.4*  CL 103 103 106  CO2 26 25 28   GLUCOSE 96 102* 100*  BUN 6 <5* <5*  CREATININE 0.51 0.50 0.43*  CALCIUM 7.5* 7.8* 7.8*  MG  --  1.8 1.6*   Liver Function Tests: Recent Labs  Lab 01/14/20 1939 01/15/20 0317  AST 18 17  ALT 16 17  ALKPHOS 38 41  BILITOT 0.2* 0.1*  PROT 6.8 6.4*  ALBUMIN 3.0* 2.7*   Recent Labs  Lab 01/14/20 1939  LIPASE 25   No results for input(s): AMMONIA in the last 168 hours. CBC: Recent Labs  Lab  01/14/20 1939 01/15/20 0317 01/16/20 0123  WBC 4.3 4.5 3.8*  NEUTROABS 1.5* 1.7  --   HGB 11.3* 11.5* 10.7*  HCT 34.8* 35.1* 33.8*  MCV 92.3 92.6 93.6  PLT 230 237 255   Cardiac Enzymes: No results for input(s): CKTOTAL, CKMB, CKMBINDEX, TROPONINI in the last 168 hours. BNP: Invalid input(s): POCBNP CBG: No results for input(s): GLUCAP in the last 168 hours. D-Dimer No results for input(s): DDIMER in the last 72 hours. Hgb A1c No results for input(s): HGBA1C in the last 72 hours. Lipid Profile No results for input(s): CHOL, HDL, LDLCALC, TRIG, CHOLHDL, LDLDIRECT in the last 72 hours. Thyroid function studies No results for input(s): TSH, T4TOTAL, T3FREE, THYROIDAB in the last 72 hours.  Invalid input(s): FREET3 Anemia work up Recent Labs    01/15/20 1438  FERRITIN 35   Urinalysis    Component Value Date/Time   COLORURINE YELLOW 01/14/2020 1939   APPEARANCEUR HAZY (A) 01/14/2020 1939   LABSPEC 1.025 01/14/2020 1939   PHURINE 6.0 01/14/2020 1939   GLUCOSEU NEGATIVE 01/14/2020 1939   HGBUR LARGE (A) 01/14/2020 1939   BILIRUBINUR NEGATIVE 01/14/2020 [place] NEGATIVE 01/14/2020 1939   PROTEINUR NEGATIVE 01/14/2020 1939   NITRITE NEGATIVE 01/14/2020 1939   LEUKOCYTESUR NEGATIVE 01/14/2020 1939   Sepsis Labs Invalid input(s): PROCALCITONIN,  WBC,  LACTICIDVEN Microbiology Recent Results (from the past 240 hour(s))  C Difficile Quick Screen w PCR reflex     Status: None   Collection Time: 01/14/20  8:55 PM   Specimen: Per Rectum; Stool  Result Value Ref Range Status   C Diff antigen NEGATIVE NEGATIVE Final   C Diff toxin NEGATIVE NEGATIVE Final   C Diff interpretation No C. difficile detected.  Final    Comment: Performed at [facility], [place] [address], [place],  [address]  Gastrointestinal Panel by PCR , Stool     Status: Abnormal   Collection Time: 01/14/20  9:35 PM   Specimen: Per Rectum; Stool  Result Value Ref Range Status    Campylobacter species DETECTED (A) NOT DETECTED Final    Comment: RESULT CALLED TO, READ BACK BY AND VERIFIED WITH: LINDSEY MOSS AT 1506 01/15/20.PMF    Plesimonas shigelloides NOT DETECTED NOT DETECTED Final   Salmonella species NOT DETECTED NOT DETECTED Final   Yersinia enterocolitica NOT DETECTED NOT DETECTED Final   Vibrio species NOT DETECTED NOT DETECTED Final   Vibrio cholerae NOT DETECTED NOT DETECTED Final  Enteroaggregative E coli (EAEC) NOT DETECTED NOT DETECTED Final   Enteropathogenic E coli (EPEC) NOT DETECTED NOT DETECTED Final   Enterotoxigenic E coli (ETEC) NOT DETECTED NOT DETECTED Final   Shiga like toxin producing E coli (STEC) NOT DETECTED NOT DETECTED Final   Shigella/Enteroinvasive E coli (EIEC) NOT DETECTED NOT DETECTED Final   Cryptosporidium NOT DETECTED NOT DETECTED Final   Cyclospora cayetanensis NOT DETECTED NOT DETECTED Final   Entamoeba histolytica NOT DETECTED NOT DETECTED Final   Giardia lamblia NOT DETECTED NOT DETECTED Final   Adenovirus F40/41 NOT DETECTED NOT DETECTED Final   Astrovirus NOT DETECTED NOT DETECTED Final   Norovirus GI/GII NOT DETECTED NOT DETECTED Final   Rotavirus A NOT DETECTED NOT DETECTED Final   Sapovirus (I, II, IV, and V) NOT DETECTED NOT DETECTED Final    Comment: Performed at [facility], [place], [place] [address]  SARS Coronavirus 2 by RT PCR (hospital order, performed in [place] hospital lab) Nasopharyngeal Nasopharyngeal Swab     Status: None   Collection Time: 01/14/20 11:31 PM   Specimen: Nasopharyngeal Swab  Result Value Ref Range Status   SARS Coronavirus 2 NEGATIVE NEGATIVE Final    Comment: (NOTE) SARS-CoV-2 target nucleic acids are NOT DETECTED. The SARS-CoV-2 RNA is generally detectable in upper and lower respiratory specimens during the acute phase of infection. The lowest concentration of SARS-CoV-2 viral copies this assay can detect is 250 copies / mL. A negative result does  not preclude SARS-CoV-2 infection and should not be used as the sole basis for treatment or other patient management decisions.  A negative result may occur with improper specimen collection / handling, submission of specimen other than nasopharyngeal swab, presence of viral mutation(s) within the areas targeted by this assay, and inadequate number of viral copies (<250 copies / mL). A negative result must be combined with clinical observations, patient history, and epidemiological information. Fact Sheet for Patients:   StrictlyIdeas.no Fact Sheet for Healthcare Providers: BankingDealers.co.za This test is not yet approved or cleared  by the [place] FDA and has been authorized for detection and/or diagnosis of SARS-CoV-2 by FDA under an Emergency Use Authorization (EUA).  This EUA will remain in effect (meaning this test can be used) for the duration of the COVID-19 declaration under Section 564(b)(1) of the Act, 21 U.S.C. section 360bbb-3(b)(1), unless the authorization is terminated or revoked sooner. Performed at [facility], [address], [place], [place] [address]     Procedures/Studies: CT Abdomen Pelvis W Contrast  Result Date: 01/14/2020 CLINICAL DATA:  54 year old female with abdominal pain and diarrhea. EXAM: CT ABDOMEN AND PELVIS WITH CONTRAST TECHNIQUE: Multidetector CT imaging of the abdomen and pelvis was performed using the standard protocol following bolus administration of intravenous contrast. CONTRAST:  150m OMNIPAQUE IOHEXOL 300 MG/ML  SOLN COMPARISON:  None. FINDINGS: Lower chest: The visualized lung bases are clear. No intra-abdominal free air or free fluid. Hepatobiliary: There are remarkable. No intrahepatic biliary ductal dilatation. Cholecystectomy. No retained calcified stone noted in the central CBD. Pancreas: Unremarkable. No pancreatic ductal dilatation or surrounding inflammatory changes.  Spleen: Normal in size without focal abnormality. Adrenals/Urinary Tract: The adrenal glands are unremarkable. There is no hydronephrosis on either side. There is symmetric enhancement and excretion of contrast by both kidneys. Subcentimeter right renal hypodense focus is too small to characterize. The visualized ureters appear unremarkable. The urinary bladder is collapsed. Stomach/Bowel: Small hiatal hernia. There is diffuse thickening and inflammatory changes  of the colon consistent with colitis. There is no bowel obstruction. The appendix is normal. Vascular/Lymphatic: The abdominal aorta and IVC unremarkable. No portal venous gas. There is no adenopathy. Reproductive: The uterus is anteverted. There is a 5 cm posterior fundal fibroid. Additional right body fibroid noted. There is a 3 cm left ovarian dominant follicle or corpus luteum. Other: None Musculoskeletal: No acute or significant osseous findings. IMPRESSION: 1. Pancolitis. Correlation with clinical exam and stool cultures recommended. No bowel obstruction. Normal appendix. 2. Uterine fibroids. Electronically Signed   By: Anner Crete M.D.   On: 01/14/2020 21:22     Time coordinating discharge: Over 30 minutes  SIGNED:   Guilford Shi, MD  [facility] 01/16/2020, 3:35 PM

## 2020-01-16 NOTE — Progress Notes (Signed)
Progress Note   Subjective  Chief Complaint:  Diarrhea with abdominal pain, stool studies positive for Campylobacter this morning  This morning patient tells me that her stool frequency has decreased, prior she was going every hour and a half, she just went in out around 10:00 and prior to this was 3 AM.  Abdominal pain is gone.  Tells me that her largest concern today is that she had a migraine overnight which they had to use Imitrex for.  She is now getting her second dose.  Tells me that Imitrex works but she is concerned about these migraines.   Objective   Vital signs in last 24 hours: Temp:  [97.9 F (36.6 C)-98.2 F (36.8 C)] 97.9 F (36.6 C) (05/31 0511) Pulse Rate:  [77-92] 77 (05/31 0511) Resp:  [17] 17 (05/31 0511) BP: (115-121)/(69-83) 121/83 (05/31 0511) SpO2:  [99 %-100 %] 99 % (05/31 0511) Last BM Date: 01/15/20 General:    Hispanic female in NAD Heart:  Regular rate and rhythm; no murmurs Lungs: Respirations even and unlabored, lungs CTA bilaterally Abdomen:  Soft, nontender and nondistended. Normal bowel sounds. Extremities:  Without edema. Psych:  Cooperative. Normal mood and affect.  Intake/Output from previous day: 05/30 0701 - 05/31 0700 In: 4034.7 [P.O.:687; I.V.:1991.8; IV Piggyback:1355.9] Out: -   Lab Results: Recent Labs    01/14/20 1939 01/15/20 0317 01/16/20 0123  WBC 4.3 4.5 3.8*  HGB 11.3* 11.5* 10.7*  HCT 34.8* 35.1* 33.8*  PLT 230 237 255   BMET Recent Labs    01/14/20 1939 01/15/20 0317 01/16/20 0123  NA 137 138 141  K 3.2* 3.5 3.4*  CL 103 103 106  CO2 26 25 28   GLUCOSE 96 102* 100*  BUN 6 <5* <5*  CREATININE 0.51 0.50 0.43*  CALCIUM 7.5* 7.8* 7.8*   LFT Recent Labs    01/15/20 0317  PROT 6.4*  ALBUMIN 2.7*  AST 17  ALT 17  ALKPHOS 41  BILITOT 0.1*    Studies/Results: CT Abdomen Pelvis W Contrast  Result Date: 01/14/2020 CLINICAL DATA:  54 year old female with abdominal pain and diarrhea. EXAM: CT ABDOMEN  AND PELVIS WITH CONTRAST TECHNIQUE: Multidetector CT imaging of the abdomen and pelvis was performed using the standard protocol following bolus administration of intravenous contrast. CONTRAST:  161m OMNIPAQUE IOHEXOL 300 MG/ML  SOLN COMPARISON:  None. FINDINGS: Lower chest: The visualized lung bases are clear. No intra-abdominal free air or free fluid. Hepatobiliary: There are remarkable. No intrahepatic biliary ductal dilatation. Cholecystectomy. No retained calcified stone noted in the central CBD. Pancreas: Unremarkable. No pancreatic ductal dilatation or surrounding inflammatory changes. Spleen: Normal in size without focal abnormality. Adrenals/Urinary Tract: The adrenal glands are unremarkable. There is no hydronephrosis on either side. There is symmetric enhancement and excretion of contrast by both kidneys. Subcentimeter right renal hypodense focus is too small to characterize. The visualized ureters appear unremarkable. The urinary bladder is collapsed. Stomach/Bowel: Small hiatal hernia. There is diffuse thickening and inflammatory changes of the colon consistent with colitis. There is no bowel obstruction. The appendix is normal. Vascular/Lymphatic: The abdominal aorta and IVC unremarkable. No portal venous gas. There is no adenopathy. Reproductive: The uterus is anteverted. There is a 5 cm posterior fundal fibroid. Additional right body fibroid noted. There is a 3 cm left ovarian dominant follicle or corpus luteum. Other: None Musculoskeletal: No acute or significant osseous findings. IMPRESSION: 1. Pancolitis. Correlation with clinical exam and stool cultures recommended. No bowel obstruction. Normal appendix. 2. Uterine  fibroids. Electronically Signed   By: Anner Crete M.D.   On: 01/14/2020 21:22     Assessment / Plan:   Assessment: 1.  Diarrhea with pancolitis on CT: Stool studies positive for Campylobacter, on day 2 of azithromycin and symptoms are improving   Plan: 1.  Continue  Azithromycin 2.  Continue monitoring electrolytes with fluid as needed 3.  Agree with regular diet today 4.  Please await any final recommendations from Dr. Rush Landmark, we will likely sign off.  Thank you for kind consultation.    LOS: 2 days   Levin Erp  01/16/2020, 10:19 AM

## 2020-01-17 ENCOUNTER — Telehealth: Payer: Self-pay

## 2020-01-17 NOTE — Telephone Encounter (Signed)
Appt made with Loretta Carroll on 02/16/20 at 230 pm the pt has been advised and states she will bring her colon report with her to the office

## 2020-01-17 NOTE — Telephone Encounter (Signed)
-----  Message from Irving Copas., MD sent at 01/16/2020  4:43 PM EDT ----- Regarding: Follow-up Loretta Carroll,This is a patient that we met in the hospital.She follows with [facility] however she states that her insurance is changed she can no longer see them.Please set her up for a follow-up with Anderson Malta or myself in 4 to 6 weeks.  We will plan a likely colonoscopy thereafter.Please obtain the colonoscopy report from [place] a few years ago.Thanks.GM

## 2020-01-18 ENCOUNTER — Other Ambulatory Visit: Payer: BLUE CROSS/BLUE SHIELD

## 2020-02-16 ENCOUNTER — Ambulatory Visit: Payer: BLUE CROSS/BLUE SHIELD | Admitting: Physician Assistant

## 2020-04-26 ENCOUNTER — Encounter (HOSPITAL_BASED_OUTPATIENT_CLINIC_OR_DEPARTMENT_OTHER): Payer: Self-pay

## 2020-04-26 ENCOUNTER — Other Ambulatory Visit: Payer: Self-pay

## 2020-04-26 ENCOUNTER — Emergency Department (HOSPITAL_BASED_OUTPATIENT_CLINIC_OR_DEPARTMENT_OTHER)
Admission: EM | Admit: 2020-04-26 | Discharge: 2020-04-26 | Disposition: A | Discharge status: Home / Self Care | Payer: BLUE CROSS/BLUE SHIELD | Attending: Emergency Medicine | Admitting: Emergency Medicine

## 2020-04-26 ENCOUNTER — Emergency Department (HOSPITAL_BASED_OUTPATIENT_CLINIC_OR_DEPARTMENT_OTHER): Payer: BLUE CROSS/BLUE SHIELD

## 2020-04-26 DIAGNOSIS — J45909 Unspecified asthma, uncomplicated: Secondary | ICD-10-CM | POA: Insufficient documentation

## 2020-04-26 DIAGNOSIS — U071 COVID-19: Secondary | ICD-10-CM | POA: Diagnosis not present

## 2020-04-26 DIAGNOSIS — I10 Essential (primary) hypertension: Secondary | ICD-10-CM | POA: Insufficient documentation

## 2020-04-26 DIAGNOSIS — Z79899 Other long term (current) drug therapy: Secondary | ICD-10-CM | POA: Diagnosis not present

## 2020-04-26 DIAGNOSIS — R05 Cough: Secondary | ICD-10-CM | POA: Diagnosis present

## 2020-04-26 HISTORY — DX: Gastritis, unspecified, without bleeding: K29.70

## 2020-04-26 HISTORY — DX: Gastro-esophageal reflux disease without esophagitis: K21.9

## 2020-04-26 LAB — CBC
HCT: 41.4 % (ref 36.0–46.0)
Hemoglobin: 13.7 g/dL (ref 12.0–15.0)
MCH: 29.8 pg (ref 26.0–34.0)
MCHC: 33.1 g/dL (ref 30.0–36.0)
MCV: 90.2 fL (ref 80.0–100.0)
Platelets: 186 10*3/uL (ref 150–400)
RBC: 4.59 MIL/uL (ref 3.87–5.11)
RDW: 12.4 % (ref 11.5–15.5)
WBC: 7.1 10*3/uL (ref 4.0–10.5)
nRBC: 0 % (ref 0.0–0.2)

## 2020-04-26 LAB — TROPONIN I (HIGH SENSITIVITY)
Troponin I (High Sensitivity): <2 ng/L (ref <18)
Troponin I (High Sensitivity): 2 ng/L (ref <18)

## 2020-04-26 LAB — BASIC METABOLIC PANEL
Anion gap: 14 (ref 5–15)
BUN: 11 mg/dL (ref 6–20)
CO2: 21 mmol/L — ABNORMAL LOW (ref 22–32)
Calcium: 9.4 mg/dL (ref 8.9–10.3)
Chloride: 101 mmol/L (ref 98–111)
Creatinine, Ser: 0.6 mg/dL (ref 0.44–1.00)
GFR calc Af Amer: >60 mL/min (ref >60)
GFR calc non Af Amer: >60 mL/min (ref >60)
Glucose, Bld: 124 mg/dL — ABNORMAL HIGH (ref 70–99)
Potassium: 3.5 mmol/L (ref 3.5–5.1)
Sodium: 136 mmol/L (ref 135–145)

## 2020-04-26 LAB — SARS CORONAVIRUS 2 BY RT PCR (HOSPITAL ORDER, PERFORMED IN ~~LOC~~ HOSPITAL LAB): SARS Coronavirus 2: POSITIVE — AB

## 2020-04-26 LAB — PREGNANCY, URINE: Preg Test, Ur: NEGATIVE

## 2020-04-26 MED ORDER — ALBUTEROL SULFATE HFA 108 (90 BASE) MCG/ACT IN AERS
2.0000 | INHALATION_SPRAY | Freq: Once | RESPIRATORY_TRACT | Status: AC
  Administered 2020-04-26: 2 via RESPIRATORY_TRACT
  Filled 2020-04-26: qty 6.7

## 2020-04-26 NOTE — ED Triage Notes (Signed)
Pt c/o cough x 1 week with +covid family member/hospitalized-also c/o CP x 2 days-NAD-steady gait

## 2020-04-26 NOTE — Discharge Instructions (Addendum)
As we discussed, your work-up today was reassuring.  Your chest x-ray did not show any pneumonia.  Your Covid test did come back as positive which is likely contributing to your symptoms.  I have referred you to the monoclonal antibody infusion clinic.  They will follow up with you if you are eligible.  Closely monitor your symptoms.  Return to the emergency department for any worsening difficulty breathing, persistent vomiting, worsening chest pain or any other worsening concerning symptoms.

## 2020-04-26 NOTE — ED Provider Notes (Signed)
[facility] EMERGENCY DEPARTMENT Provider Note   CSN: [id number] Arrival date & time: 04/26/20  1212     History Chief Complaint  Patient presents with  . Cough    + Covid    [facility] is a 54 y.o. female with PMH/o asthma, GERD, HTN who presents for evaluation of chest pressure, cough that began yesterday.  She reports that about a week or so ago, she had an episode of fever and chills.  She states that has resolved.  She states that over the last 2 days, she has developed some chest pressure and cough.  Cough is nonproductive.  She has not had any difficulty breathing.  She states she has had some fatigue.  She has not had any abdominal pain, nausea/vomiting.  She has had some occasional episodes of diarrhea.  She reports she did not get Covid vaccinated.  She reports that her husband was recently Covid positive and is now currently in the hospital.  She does not smoke.  She does have a history of high blood pressure.  No history of diabetes.  No personal cardiac history.  The history is provided by the patient.       Past Medical History:  Diagnosis Date  . Asthma   . Gastritis   . GERD (gastroesophageal reflux disease)   . Hypertension   . Migraine     Patient Active Problem List   Diagnosis Date Noted  . GERD without esophagitis 01/15/2020  . Mild intermittent asthma, uncomplicated 78/29/5621  . Acute infectious diarrhea 01/15/2020  . Hypokalemia due to excessive gastrointestinal loss of potassium 01/15/2020  . Pancolitis ([place]) 01/14/2020  . Migraine     History reviewed. No pertinent surgical history.   OB History   No obstetric history on file.     No family history on file.  Social History   Tobacco Use  . Smoking status: Never Smoker  . Smokeless tobacco: Never Used  Vaping Use  . Vaping Use: Never used  Substance Use Topics  . Alcohol use: No  . Drug use: No    Home Medications Prior to Admission medications   Medication Sig Start  Date End Date Taking? Authorizing Provider  atenolol (TENORMIN) 25 MG tablet Take 25 mg by mouth daily. 06/30/19  Yes [provider]  gabapentin (NEURONTIN) 600 MG tablet Take 600 mg by mouth at bedtime as needed (muscle/back pain).  12/19/19  Yes [provider]  omeprazole (PRILOSEC) 40 MG capsule Take 40 mg by mouth daily as needed (reflux).    Yes [provider]  albuterol (PROVENTIL HFA;VENTOLIN HFA) 108 (90 BASE) MCG/ACT inhaler Inhale 2 puffs into the lungs every 6 (six) hours as needed for shortness of breath.    [provider]  albuterol (PROVENTIL) (2.5 MG/3ML) 0.083% nebulizer solution Take 2.5 mg by nebulization every 6 (six) hours as needed for wheezing or shortness of breath.    [provider]  budesonide (PULMICORT) 180 MCG/ACT inhaler Inhale 1 puff into the lungs every morning.    [provider]  butalbital-acetaminophen-caffeine (FIORICET) 50-325-40 MG tablet Take 1 tablet by mouth every 6 (six) hours as needed for headache. 01/16/20 01/15/21  Guilford Shi, MD  cetirizine (ZYRTEC) 10 MG tablet Take 10 mg by mouth daily. 12/20/19   [provider]  iron polysaccharides (NIFEREX) 150 MG capsule Take 150 mg by mouth every Monday, Wednesday, and Friday. 12/14/19   [provider]  montelukast (SINGULAIR) 10 MG tablet Take 10  mg by mouth at bedtime.    [provider]  ondansetron (ZOFRAN) 4 MG tablet Take 1 tablet (4 mg total) by mouth every 6 (six) hours as needed for nausea. 01/16/20   Guilford Shi, MD  SUMAtriptan (IMITREX) 100 MG tablet Take 100 mg by mouth every 2 (two) hours as needed for migraine or headache. May repeat in 2 hours if headache persists or recurs.    [provider]    Allergies    Shellfish allergy  Review of Systems   Review of Systems  Constitutional: Positive for fatigue and fever.  Respiratory: Positive for cough. Negative for shortness of breath.    Cardiovascular: Positive for chest pain.  Gastrointestinal: Negative for abdominal pain, nausea and vomiting.  Genitourinary: Negative for dysuria and hematuria.  Neurological: Negative for weakness, numbness and headaches.  All other systems reviewed and are negative.   Physical Exam Updated Vital Signs BP 136/87 (BP Location: Right Arm)   Pulse 93   Temp 99.2 F (37.3 C) (Oral)   Resp 20   Ht 5' 2"  (1.575 m)   Wt 78.9 kg   SpO2 100%   BMI 31.83 kg/m   Physical Exam Vitals and nursing note reviewed.  Constitutional:      Appearance: Normal appearance. She is well-developed.     Comments: Sitting comfortably on examination table  HENT:     Head: Normocephalic and atraumatic.  Eyes:     General: Lids are normal.     Conjunctiva/sclera: Conjunctivae normal.     Pupils: Pupils are equal, round, and reactive to light.  Cardiovascular:     Rate and Rhythm: Normal rate and regular rhythm.     Pulses: Normal pulses.     Heart sounds: Normal heart sounds. No murmur heard.  No friction rub. No gallop.   Pulmonary:     Effort: Pulmonary effort is normal.     Breath sounds: Normal breath sounds.     Comments: Lungs clear to auscultation bilaterally.  Symmetric chest rise.  No wheezing, rales, rhonchi. Abdominal:     Palpations: Abdomen is soft. Abdomen is not rigid.     Tenderness: There is no abdominal tenderness. There is no guarding.     Comments: Abdomen is soft, non-distended, non-tender. No rigidity, No guarding. No peritoneal signs.  Musculoskeletal:        General: Normal range of motion.     Cervical back: Full passive range of motion without pain.  Skin:    General: Skin is warm and dry.     Capillary Refill: Capillary refill takes less than 2 seconds.  Neurological:     Mental Status: She is alert and oriented to person, place, and time.  Psychiatric:        Speech: Speech normal.     ED Results / Procedures / Treatments   Labs (all labs ordered are  listed, but only abnormal results are displayed) Labs Reviewed  SARS CORONAVIRUS 2 BY RT PCR ([facility]) - Abnormal; Notable for the following components:      Result Value   SARS Coronavirus 2 POSITIVE (*)    All other components within normal limits  BASIC METABOLIC PANEL - Abnormal; Notable for the following components:   CO2 21 (*)    Glucose, Bld 124 (*)    All other components within normal limits  CBC  PREGNANCY, URINE  TROPONIN I (HIGH SENSITIVITY)  TROPONIN I (HIGH SENSITIVITY)    EKG  EKG Interpretation  Date/Time:  Thursday April 26 2020 12:23:44 EDT Ventricular Rate:  87 PR Interval:  136 QRS Duration: 82 QT Interval:  372 QTC Calculation: 447 R Axis:   70 Text Interpretation: Normal sinus rhythm Nonspecific ST abnormality Abnormal ECG No significant change since last tracing Confirmed by Deno Etienne [phone]) on 04/26/2020 2:43:37 PM   Radiology DG Chest Portable 1 View  Result Date: 04/26/2020 CLINICAL DATA:  Cough for 1 week.  COVID-19 exposure EXAM: PORTABLE CHEST 1 VIEW COMPARISON:  10/25/2012 FINDINGS: The heart size and mediastinal contours are within normal limits. Both lungs are clear. The visualized skeletal structures are unremarkable. IMPRESSION: No active disease. Electronically Signed   By: Davina Poke D.O.   On: 04/26/2020 13:26    Procedures Procedures (including critical care time)  Medications Ordered in ED Medications  albuterol (VENTOLIN HFA) 108 (90 Base) MCG/ACT inhaler 2 puff (2 puffs Inhalation Given 04/26/20 1526)    ED Course  I have reviewed the triage vital signs and the nursing notes.  Pertinent labs & imaging results that were available during my care of the patient were reviewed by me and considered in my medical decision making (see chart for details).    MDM Rules/Calculators/A&P                          54 year old female who presents for evaluation of cough, fatigue, chest  pressure.  She reports had a fever about a week ago but has since resolved.  Cough and chest pressure began 2 days ago.  Husband is sick with COVID-19.  She did not get vaccinated.  On initially arrival, she is afebrile nontoxic-appearing.  Vital signs are stable.  No evidence of hypoxia.  Lungs clear to auscultation.  No evidence of respiratory stress.  Suspect this is most likely infectious process, specifically COVID-19.  Doubt that this is cardiac in nature.  No evidence of respiratory distress.  We will plan to check labs, chest x-ray, Covid test.  Patient is COVID positive. CBC shows no leukocytosis or anemia.  BMP shows normal BUN and creatinine.  Initial troponin negative.  Urine pregnancy negative.  Chest x-ray shows no evidence of infectious etiology.  Delta trop is negative.   Patient ambulated while maintaining O2 sats of 99-100% on room air.  At this time, patient is hemodynamically stable with no signs of hypoxia in the ED.  Her work-up is reassuring.  At this time, do not feel that patient needs to be admitted for COVID-19.  She does have a history of high blood pressure and asthma.  We will refer her to monoclonal antibody infusion clinic for potential infusion.  I discussed this with her using the interpreter. At this time, patient exhibits no emergent life-threatening condition that require further evaluation in ED. Patient had ample opportunity for questions and discussion. All patient's questions were answered with full understanding. Strict return precautions discussed. Patient expresses understanding and agreement to plan.   Clelia Brandon was evaluated in Emergency Department on 04/26/2020 for the symptoms described in the history of present illness. She was evaluated in the context of the global COVID-19 pandemic, which necessitated consideration that the patient might be at risk for infection with the SARS-CoV-2 virus that causes COVID-19. Institutional protocols and algorithms that  pertain to the evaluation of patients at risk for COVID-19 are in a state of rapid change based on information released by regulatory bodies including the CDC and federal and  state organizations. These policies and algorithms were followed during the patient's care in the ED.  Final Clinical Impression(s) / ED Diagnoses Final diagnoses:  [id number]    Rx / DC Orders ED Discharge Orders    None       Volanda Napoleon, PA-C 04/26/20 [place], Guadalupe, DO 04/27/20 [phone]

## 2020-04-26 NOTE — ED Notes (Signed)
PT ambulated in room for several minutes on pulse ox. No SOB or labored breathing. O2 sat maintained 99-100%.

## 2020-04-27 ENCOUNTER — Telehealth: Payer: Self-pay | Admitting: Physician Assistant

## 2020-04-27 NOTE — Telephone Encounter (Signed)
Called to discuss with Dejon Evinger about Covid symptoms and the use of casirivimab and imdevimab, a monoclonal antibody infusion for those with mild to moderate Covid symptoms and at a high risk of hospitalization.     Pt is qualified for this infusion at the [facility] hospital due to co-morbid conditions and/or a member of an at-risk group, however declines infusion at this time. Symptoms tier reviewed as well as criteria for ending isolation.  Symptoms reviewed that would warrant ED/Hospital evaluation. Preventative practices reviewed. Patient verbalized understanding. Patient advised to call back if he decides that he does want to get infusion. Callback number to the infusion center given. Patient advised to go to Urgent care or ED with severe symptoms.   BMI > 25 Hx of Asthma and HTN  Spanish information of MAB sent to the patient per request. She will call us back if interested. Language line used for interpretation.   Leanor Kail, [place]

## 2020-04-28 ENCOUNTER — Emergency Department (HOSPITAL_BASED_OUTPATIENT_CLINIC_OR_DEPARTMENT_OTHER)
Admission: EM | Admit: 2020-04-28 | Discharge: 2020-04-28 | Disposition: A | Discharge status: Home / Self Care | Payer: BLUE CROSS/BLUE SHIELD | Attending: Emergency Medicine | Admitting: Emergency Medicine

## 2020-04-28 ENCOUNTER — Other Ambulatory Visit: Payer: Self-pay

## 2020-04-28 ENCOUNTER — Encounter (HOSPITAL_BASED_OUTPATIENT_CLINIC_OR_DEPARTMENT_OTHER): Payer: Self-pay

## 2020-04-28 DIAGNOSIS — I10 Essential (primary) hypertension: Secondary | ICD-10-CM | POA: Insufficient documentation

## 2020-04-28 DIAGNOSIS — Z7951 Long term (current) use of inhaled steroids: Secondary | ICD-10-CM | POA: Diagnosis not present

## 2020-04-28 DIAGNOSIS — J4520 Mild intermittent asthma, uncomplicated: Secondary | ICD-10-CM | POA: Diagnosis not present

## 2020-04-28 DIAGNOSIS — R21 Rash and other nonspecific skin eruption: Secondary | ICD-10-CM | POA: Diagnosis present

## 2020-04-28 DIAGNOSIS — Z79899 Other long term (current) drug therapy: Secondary | ICD-10-CM | POA: Diagnosis not present

## 2020-04-28 MED ORDER — PREDNISONE 20 MG PO TABS: 20.0000 mg | ORAL_TABLET | Freq: Every day | ORAL | 0 refills | Status: AC

## 2020-04-28 MED ORDER — DIPHENHYDRAMINE HCL 25 MG PO TABS: 25.0000 mg | ORAL_TABLET | Freq: Four times a day (QID) | ORAL | 0 refills | Status: AC | PRN

## 2020-04-28 NOTE — Discharge Instructions (Addendum)
Stop azithromycin. Increase prednisone to 40 mg for the next 3 days. Benadryl as needed for rash/itching. You can try over the counter skin cream/lotion like calamine as needed for itching as well. Return to ER if you feeling your symptoms are significantly worsening particularly with your breathing or you feel faint.

## 2020-04-28 NOTE — ED Triage Notes (Signed)
Pt arrives with c/o rash over entire body pt has recently started new medications r/t her Covid infection. Pt reports some trouble swallowing as well, able to maintain secretions in triage and speak in full sentences. NAD, VSS.

## 2020-05-01 ENCOUNTER — Other Ambulatory Visit: Payer: BLUE CROSS/BLUE SHIELD

## 2020-05-02 NOTE — ED Provider Notes (Signed)
[facility] Provider Note   CSN: [id number] Arrival date & time: 04/28/20  1047     History Chief Complaint  Patient presents with  . Rash    COVID+    Loretta [facility] is a 53 y.o. female.  HPI   54yF with rash. Diffuse. Currently on azithromycin day 4 that she was started on after being diagnosed with COVID. No new exposures that she is aware of. Doesn't hurt or really itch but is somewhat irritating. No fever. Mild throat tightness. No change in voice. No wheezing. No dyspnea. No dizziness or lightheadedness. No acute GI complaints.   Past Medical History:  Diagnosis Date  . Asthma   . Gastritis   . GERD (gastroesophageal reflux disease)   . Hypertension   . Migraine     Patient Active Problem List   Diagnosis Date Noted  . GERD without esophagitis 01/15/2020  . Mild intermittent asthma, uncomplicated 28/31/5176  . Acute infectious diarrhea 01/15/2020  . Hypokalemia due to excessive gastrointestinal loss of potassium 01/15/2020  . Pancolitis ([place]) 01/14/2020  . Migraine     History reviewed. No pertinent surgical history.   OB History   No obstetric history on file.     No family history on file.  Social History   Tobacco Use  . Smoking status: Never Smoker  . Smokeless tobacco: Never Used  Vaping Use  . Vaping Use: Never used  Substance Use Topics  . Alcohol use: No  . Drug use: No    Home Medications Prior to Admission medications   Medication Sig Start Date End Date Taking? Authorizing Provider  albuterol (PROVENTIL HFA;VENTOLIN HFA) 108 (90 BASE) MCG/ACT inhaler Inhale 2 puffs into the lungs every 6 (six) hours as needed for shortness of breath.    [provider]  albuterol (PROVENTIL) (2.5 MG/3ML) 0.083% nebulizer solution Take 2.5 mg by nebulization every 6 (six) hours as needed for wheezing or shortness of breath.    [provider]  atenolol (TENORMIN) 25 MG tablet Take 25 mg by mouth daily.  06/30/19   [provider]  budesonide (PULMICORT) 180 MCG/ACT inhaler Inhale 1 puff into the lungs every morning.    [provider]  butalbital-acetaminophen-caffeine (FIORICET) 50-325-40 MG tablet Take 1 tablet by mouth every 6 (six) hours as needed for headache. 01/16/20 01/15/21  Guilford Shi, MD  cetirizine (ZYRTEC) 10 MG tablet Take 10 mg by mouth daily. 12/20/19   [provider]  diphenhydrAMINE (BENADRYL) 25 MG tablet Take 1 tablet (25 mg total) by mouth every 6 (six) hours as needed. 04/28/20   Virgel Manifold, MD  gabapentin (NEURONTIN) 600 MG tablet Take 600 mg by mouth at bedtime as needed (muscle/back pain).  12/19/19   [provider]  iron polysaccharides (NIFEREX) 150 MG capsule Take 150 mg by mouth every Monday, Wednesday, and Friday. 12/14/19   [provider]  montelukast (SINGULAIR) 10 MG tablet Take 10 mg by mouth at bedtime.    [provider]  omeprazole (PRILOSEC) 40 MG capsule Take 40 mg by mouth daily as needed (reflux).     [provider]  ondansetron (ZOFRAN) 4 MG tablet Take 1 tablet (4 mg total) by mouth every 6 (six) hours as needed for nausea. 01/16/20   Guilford Shi, MD  predniSONE (DELTASONE) 20 MG tablet Take 1 tablet (20 mg total) by mouth daily. 04/28/20   Virgel Manifold, MD  SUMAtriptan (IMITREX) 100 MG tablet Take 100 mg by mouth every  2 (two) hours as needed for migraine or headache. May repeat in 2 hours if headache persists or recurs.    [provider]    Allergies    Shellfish allergy  Review of Systems   Review of Systems All systems reviewed and negative, other than as noted in HPI.  Physical Exam Updated Vital Signs BP 123/74 (BP Location: Left Arm)   Pulse 61   Temp 98 F (36.7 C) (Oral)   Resp 18   Ht 5' 2"  (1.575 m)   Wt 78.9 kg   SpO2 100%   BMI 31.83 kg/m   Physical Exam Vitals and nursing note reviewed.  Constitutional:      General: She is not in acute  distress.    Appearance: She is well-developed.  HENT:     Head: Normocephalic and atraumatic.  Eyes:     General:        Right eye: No discharge.        Left eye: No discharge.     Conjunctiva/sclera: Conjunctivae normal.  Cardiovascular:     Rate and Rhythm: Normal rate and regular rhythm.     Heart sounds: Normal heart sounds. No murmur heard.  No friction rub. No gallop.   Pulmonary:     Effort: Pulmonary effort is normal. No respiratory distress.     Breath sounds: Normal breath sounds.  Abdominal:     General: There is no distension.     Palpations: Abdomen is soft.     Tenderness: There is no abdominal tenderness.  Musculoskeletal:        General: No tenderness.     Cervical back: Neck supple.  Skin:    General: Skin is warm and dry.     Findings: Rash present.  Neurological:     Mental Status: She is alert.  Psychiatric:        Behavior: Behavior normal.        Thought Content: Thought content normal.     ED Results / Procedures / Treatments   Labs (all labs ordered are listed, but only abnormal results are displayed) Labs Reviewed - No data to display  EKG None  Radiology No results found.  Procedures Procedures (including critical care time)  Medications Ordered in ED Medications - No data to display  ED Course  I have reviewed the triage vital signs and the nursing notes.  Pertinent labs & imaging results that were available during my care of the patient were reviewed by me and considered in my medical decision making (see chart for details).    MDM Rules/Calculators/A&P                          54yF with nonspecific rash. Afebrile. Nontoxic. No mucus membrane involvement. Not sure if related to azithromycin or not. Advised for her to just stop. On day 4 and not clear benefit with covid. Doubt serious reaction if so. PRN antihistamines. Return precautions discussed. OUtpt FU otherwise.  Final Clinical Impression(s) / ED Diagnoses Final  diagnoses:  Rash    Rx / DC Orders ED Discharge Orders         Ordered    predniSONE (DELTASONE) 20 MG tablet  Daily        04/28/20 1409    diphenhydrAMINE (BENADRYL) 25 MG tablet  Every 6 hours PRN        04/28/20 1409           Virgel Manifold,  MD 05/02/20 1303

## 2020-07-22 ENCOUNTER — Emergency Department (HOSPITAL_BASED_OUTPATIENT_CLINIC_OR_DEPARTMENT_OTHER)
Admission: EM | Admit: 2020-07-22 | Discharge: 2020-07-22 | Disposition: A | Discharge status: Home / Self Care | Payer: BLUE CROSS/BLUE SHIELD | Attending: Emergency Medicine | Admitting: Emergency Medicine

## 2020-07-22 ENCOUNTER — Other Ambulatory Visit: Payer: Self-pay

## 2020-07-22 ENCOUNTER — Emergency Department (HOSPITAL_BASED_OUTPATIENT_CLINIC_OR_DEPARTMENT_OTHER): Payer: BLUE CROSS/BLUE SHIELD

## 2020-07-22 ENCOUNTER — Encounter (HOSPITAL_BASED_OUTPATIENT_CLINIC_OR_DEPARTMENT_OTHER): Payer: Self-pay | Admitting: Emergency Medicine

## 2020-07-22 DIAGNOSIS — Z79899 Other long term (current) drug therapy: Secondary | ICD-10-CM | POA: Diagnosis not present

## 2020-07-22 DIAGNOSIS — I10 Essential (primary) hypertension: Secondary | ICD-10-CM | POA: Diagnosis not present

## 2020-07-22 DIAGNOSIS — J4520 Mild intermittent asthma, uncomplicated: Secondary | ICD-10-CM | POA: Diagnosis not present

## 2020-07-22 DIAGNOSIS — Z8669 Personal history of other diseases of the nervous system and sense organs: Secondary | ICD-10-CM | POA: Diagnosis not present

## 2020-07-22 DIAGNOSIS — R519 Headache, unspecified: Secondary | ICD-10-CM | POA: Insufficient documentation

## 2020-07-22 LAB — BASIC METABOLIC PANEL
Anion gap: 7 (ref 5–15)
BUN: 16 mg/dL (ref 6–20)
CO2: 29 mmol/L (ref 22–32)
Calcium: 9.4 mg/dL (ref 8.9–10.3)
Chloride: 100 mmol/L (ref 98–111)
Creatinine, Ser: 0.48 mg/dL (ref 0.44–1.00)
GFR, Estimated: >60 mL/min (ref >60)
Glucose, Bld: 93 mg/dL (ref 70–99)
Potassium: 4.3 mmol/L (ref 3.5–5.1)
Sodium: 136 mmol/L (ref 135–145)

## 2020-07-22 LAB — CBC WITH DIFFERENTIAL/PLATELET
Abs Immature Granulocytes: 0.01 10*3/uL (ref 0.00–0.07)
Basophils Absolute: 0 10*3/uL (ref 0.0–0.1)
Basophils Relative: 0 %
Eosinophils Absolute: 0.2 10*3/uL (ref 0.0–0.5)
Eosinophils Relative: 3 %
HCT: 38.5 % (ref 36.0–46.0)
Hemoglobin: 12.8 g/dL (ref 12.0–15.0)
Immature Granulocytes: 0 %
Lymphocytes Relative: 36 %
Lymphs Abs: 2.3 10*3/uL (ref 0.7–4.0)
MCH: 30.6 pg (ref 26.0–34.0)
MCHC: 33.2 g/dL (ref 30.0–36.0)
MCV: 92.1 fL (ref 80.0–100.0)
Monocytes Absolute: 0.6 10*3/uL (ref 0.1–1.0)
Monocytes Relative: 10 %
Neutro Abs: 3.2 10*3/uL (ref 1.7–7.7)
Neutrophils Relative %: 51 %
Platelets: 184 10*3/uL (ref 150–400)
RBC: 4.18 MIL/uL (ref 3.87–5.11)
RDW: 13.5 % (ref 11.5–15.5)
WBC: 6.3 10*3/uL (ref 4.0–10.5)
nRBC: 0 % (ref 0.0–0.2)

## 2020-07-22 MED ORDER — METOCLOPRAMIDE HCL 5 MG/ML IJ SOLN
10.0000 mg | Freq: Once | INTRAMUSCULAR | Status: AC
  Administered 2020-07-22: 10 mg via INTRAVENOUS
  Filled 2020-07-22: qty 2

## 2020-07-22 MED ORDER — ACETAMINOPHEN ER 650 MG PO TBCR: 650.0000 mg | EXTENDED_RELEASE_TABLET | Freq: Three times a day (TID) | ORAL | 0 refills | Status: AC

## 2020-07-22 MED ORDER — IBUPROFEN 400 MG PO TABS: 400.0000 mg | ORAL_TABLET | Freq: Four times a day (QID) | ORAL | 0 refills | Status: AC

## 2020-07-22 MED ORDER — HALOPERIDOL LACTATE 5 MG/ML IJ SOLN
5.0000 mg | Freq: Once | INTRAMUSCULAR | Status: DC
  Filled 2020-07-22: qty 1

## 2020-07-22 MED ORDER — ACETAMINOPHEN 500 MG PO TABS
1000.0000 mg | ORAL_TABLET | Freq: Once | ORAL | Status: DC
  Filled 2020-07-22: qty 2

## 2020-07-22 MED ORDER — KETOROLAC TROMETHAMINE 30 MG/ML IJ SOLN
15.0000 mg | Freq: Once | INTRAMUSCULAR | Status: AC
  Administered 2020-07-22: 15 mg via INTRAVENOUS
  Filled 2020-07-22: qty 1

## 2020-07-22 MED ORDER — IOHEXOL 300 MG/ML  SOLN
75.0000 mL | Freq: Once | INTRAMUSCULAR | Status: AC
  Administered 2020-07-22: 75 mL via INTRAVENOUS

## 2020-07-22 NOTE — ED Notes (Signed)
Pt refuses tylenol, MD notified

## 2020-07-22 NOTE — ED Triage Notes (Signed)
Ongoing headache since October. States she has inflammation in her neck. She has been treated with abx and has had medication for HTN changed but symptoms persist.

## 2020-07-22 NOTE — Discharge Instructions (Signed)
We saw you in the ER for headaches. All the labs and imaging are normal. We are not sure what is causing your headaches, however, there appears to be no evidence of infection, bleeds or tumors based on our exam and results.  Take ibuprofen 600 mg every 6 hours for the next 3 days.  See the primary care doctor as planned to discuss your neck complaints.  See the neurologist for your headache.  Call and set up an appointment as soon as possible with a neurologist.

## 2020-07-23 NOTE — ED Provider Notes (Signed)
[facility] DEPARTMENT Provider Note   CSN: [id number] Arrival date & time: 07/22/20  1330     History Chief Complaint  Patient presents with  . Headache    Loretta Carroll is a 54 y.o. female.  HPI     54 year old female comes in a chief complaint of headache and neck pain.  She has history of GERD, gastritis and hypertension plus migraine. Patient reports that she has been having a headache for more than a month now.  She is also having some neck discomfort.  This current episode started with her having neck discomfort and a palpable nodule on the left submandibular region month and a half ago.  Since then she has had daily headaches that are constant.  The pain radiates to the back of her head head.  The nodule is getting larger.  Her PCP has ordered CT scan of her soft tissue.  More recently, she is having difficulty falling asleep because of the severe pain and so she decided to come to the ER.  Of note, patient reports that on few occasions she has had ringing in her ear and when she has tried to clean her ear she has noted clear drainage.  Patient denies any focal numbness, weakness, slurred speech.   Past Medical History:  Diagnosis Date  . Asthma   . Gastritis   . GERD (gastroesophageal reflux disease)   . Hypertension   . Migraine     Patient Active Problem List   Diagnosis Date Noted  . GERD without esophagitis 01/15/2020  . Mild intermittent asthma, uncomplicated 40/98/1191  . Acute infectious diarrhea 01/15/2020  . Hypokalemia due to excessive gastrointestinal loss of potassium 01/15/2020  . Pancolitis ([place]) 01/14/2020  . Migraine     History reviewed. No pertinent surgical history.   OB History   No obstetric history on file.     No family history on file.  Social History   Tobacco Use  . Smoking status: Never Smoker  . Smokeless tobacco: Never Used  Vaping Use  . Vaping Use: Never used  Substance Use Topics  . Alcohol use: No   . Drug use: No    Home Medications Prior to Admission medications   Medication Sig Start Date End Date Taking? Authorizing Provider  acetaminophen (TYLENOL 8 HOUR) 650 MG CR tablet Take 1 tablet (650 mg total) by mouth every 8 (eight) hours. 07/22/20   Varney Biles, MD  albuterol (PROVENTIL HFA;VENTOLIN HFA) 108 (90 BASE) MCG/ACT inhaler Inhale 2 puffs into the lungs every 6 (six) hours as needed for shortness of breath.    [provider]  albuterol (PROVENTIL) (2.5 MG/3ML) 0.083% nebulizer solution Take 2.5 mg by nebulization every 6 (six) hours as needed for wheezing or shortness of breath.    [provider]  atenolol (TENORMIN) 25 MG tablet Take 25 mg by mouth daily. 06/30/19   [provider]  budesonide (PULMICORT) 180 MCG/ACT inhaler Inhale 1 puff into the lungs every morning.    [provider]  butalbital-acetaminophen-caffeine (FIORICET) 50-325-40 MG tablet Take 1 tablet by mouth every 6 (six) hours as needed for headache. 01/16/20 01/15/21  Guilford Shi, MD  cetirizine (ZYRTEC) 10 MG tablet Take 10 mg by mouth daily. 12/20/19   [provider]  diphenhydrAMINE (BENADRYL) 25 MG tablet Take 1 tablet (25 mg total) by mouth every 6 (six) hours as needed. 04/28/20   Virgel Manifold, MD  gabapentin (NEURONTIN) 600 MG tablet Take 600 mg by  mouth at bedtime as needed (muscle/back pain).  12/19/19   [provider]  ibuprofen (ADVIL) 400 MG tablet Take 1 tablet (400 mg total) by mouth 4 (four) times daily. 07/22/20   Varney Biles, MD  iron polysaccharides (NIFEREX) 150 MG capsule Take 150 mg by mouth every Monday, Wednesday, and Friday. 12/14/19   [provider]  montelukast (SINGULAIR) 10 MG tablet Take 10 mg by mouth at bedtime.    [provider]  omeprazole (PRILOSEC) 40 MG capsule Take 40 mg by mouth daily as needed (reflux).     [provider]  ondansetron (ZOFRAN) 4 MG tablet Take 1 tablet (4 mg total)  by mouth every 6 (six) hours as needed for nausea. 01/16/20   Guilford Shi, MD  predniSONE (DELTASONE) 20 MG tablet Take 1 tablet (20 mg total) by mouth daily. 04/28/20   Virgel Manifold, MD  SUMAtriptan (IMITREX) 100 MG tablet Take 100 mg by mouth every 2 (two) hours as needed for migraine or headache. May repeat in 2 hours if headache persists or recurs.    [provider]    Allergies    Shellfish allergy  Review of Systems   Review of Systems  Constitutional: Positive for activity change.  HENT: Positive for ear pain and tinnitus. Negative for drooling.   Respiratory: Negative for shortness of breath.   Cardiovascular: Negative for chest pain.  Gastrointestinal: Negative for nausea and vomiting.  Musculoskeletal: Negative for neck pain and neck stiffness.  Allergic/Immunologic: Negative for immunocompromised state.  Neurological: Positive for headaches.  All other systems reviewed and are negative.   Physical Exam Updated Vital Signs BP (!) 109/94 (BP Location: Left Arm)   Pulse 71   Temp 97.7 F (36.5 C) (Oral)   Resp 18   Ht 5' 2"  (1.575 m)   Wt 81.6 kg   SpO2 93%   BMI 32.92 kg/m   Physical Exam Vitals and nursing note reviewed.  Constitutional:      Appearance: She is well-developed.  HENT:     Head: Normocephalic and atraumatic.  Eyes:     Extraocular Movements:     Right eye: Normal extraocular motion.     Left eye: Normal extraocular motion.  Neck:     Comments: Left-sided submandibular glands are prominent. Cardiovascular:     Rate and Rhythm: Normal rate.  Pulmonary:     Effort: Pulmonary effort is normal.  Abdominal:     General: Bowel sounds are normal.  Musculoskeletal:     Cervical back: Normal range of motion and neck supple.  Skin:    General: Skin is warm and dry.  Neurological:     Mental Status: She is alert and oriented to person, place, and time.     ED Results / Procedures / Treatments   Labs (all labs ordered are  listed, but only abnormal results are displayed) Labs Reviewed  BASIC METABOLIC PANEL  CBC WITH DIFFERENTIAL/PLATELET    EKG None  Radiology CT Head Wo Contrast  Result Date: 07/22/2020 CLINICAL DATA:  Headache. EXAM: CT HEAD WITHOUT CONTRAST TECHNIQUE: Contiguous axial images were obtained from the base of the skull through the vertex without intravenous contrast. COMPARISON:  02/09/2018 FINDINGS: Brain: No evidence of acute infarction, hemorrhage, hydrocephalus, extra-axial collection or mass lesion/mass effect. Vascular: No hyperdense vessel or unexpected calcification. Skull: Normal. Negative for fracture or focal lesion. Sinuses/Orbits: Globes and orbits are unremarkable. Small mucous retention cyst in the posterior right maxillary sinus. Sinuses otherwise clear. Other: None.  IMPRESSION: 1. No intracranial abnormality. Electronically Signed   By: Lajean Manes M.D.   On: 07/22/2020 20:09   CT Soft Tissue Neck W Contrast  Result Date: 07/22/2020 CLINICAL DATA:  Parotid region mass; left submandibular mass and right ear tinnitus and clear liquid drainage. Additional history provided: Patient reports ongoing headaches since October, inflammation in neck previously treated with antibiotics, previously head medication for hypertension. EXAM: CT NECK WITH CONTRAST TECHNIQUE: Multidetector CT imaging of the neck was performed using the standard protocol following the bolus administration of intravenous contrast. CONTRAST:  18m OMNIPAQUE IOHEXOL 300 MG/ML  SOLN COMPARISON:  Concurrently performed noncontrast head CT 07/22/2020. Head CT 03/11/2018. Brain MRI 04/14/2014. FINDINGS: Pharynx and larynx: Streak artifact from dental restoration partially obscures the oral cavity. No appreciable swelling or discrete mass within the oral cavity, pharynx or larynx. Postinflammatory calcifications within the palatine tonsils. Salivary glands: Small nodules within the bilateral parotid glands likely reflect  nonenlarged lymph nodes. The bilateral parotid and submandibular glands are otherwise unremarkable. Mild nonspecific prominence of the right sublingual gland without appreciable mass (for instance as seen on series 6, image 44). Thyroid: Unremarkable Lymph nodes: No pathologically enlarged cervical chain lymph nodes. Vascular: The major vascular structures of the neck are patent. Limited intracranial: No acute finding at the imaged levels. Partially empty sella turcica. Visualized orbits: No mass or acute finding. Mastoids and visualized paranasal sinuses: Small right maxillary sinus mucous retention cyst. No appreciable middle ear or mastoid effusion. Skeleton: No acute bony abnormality or aggressive osseous lesion. Upper chest: No consolidation within the imaged lung apices. IMPRESSION: Mild nonspecific prominence of the right sublingual gland without appreciable mass or surrounding inflammation. Small nodules within the parotid glands likely reflect nonenlarged lymph nodes. The parotid and submandibular glands are otherwise unremarkable. No pathologically enlarged cervical chain lymph nodes. No appreciable middle ear or mastoid effusion. Small right maxillary sinus mucous retention cyst. Partially empty sella turcica. This finding is very commonly incidental, but can be associated with idiopathic intracranial hypertension. Electronically Signed   By: KKellie SimmeringDO   On: 07/22/2020 20:30    Procedures Procedures (including critical care time)  Medications Ordered in ED Medications  metoCLOPramide (REGLAN) injection 10 mg (10 mg Intravenous Given 07/22/20 1901)  iohexol (OMNIPAQUE) 300 MG/ML solution 75 mL (75 mLs Intravenous Contrast Given 07/22/20 1950)  ketorolac (TORADOL) 30 MG/ML injection 15 mg (15 mg Intravenous Given 07/22/20 2053)    ED Course  I have reviewed the triage vital signs and the nursing notes.  Pertinent labs & imaging results that were available during my care of the patient  were reviewed by me and considered in my medical decision making (see chart for details).    MDM Rules/Calculators/A&P                          54year old female comes in a chief complaint of headaches.  She has history of migraines, but the current headaches are different.  Headaches have been present now for more than a month.  Interestingly, she reports that she is having some clear liquid drainage from her right ear and tinnitus.  That adds intracranial hypotension as potential cause for the headache as well. Given her age, CT head will be ordered to ensure there is no large cyst, tumor.    It appears that outpatient test has been ordered for her neck.  She has been complaining of mass over her neck on the left side,  that is gotten larger.  On exam there is prominent left-sided submandibular lymphadenopathy but no evidence of any abscess.  There is no nuchal rigidity, meningismus, hard signs of airway compromise.  I discussed the case with the radiologist.  I have decided that the best course of action will be to get CT soft tissue with contrast with contrast enhancement in the temporal region to ensure there is no findings that would suggest CSF leak.  Patient is in agreement with the plan for this imaging.   Reassessment: Results of the ER work-up discussed with the patient.  CT scans are entirely normal.  Her headaches have improved with Haldol.  Wondering if she is having complex migraine.  Will give neurology follow-up.  Informed her that her PCP can advise her further on the neck mass she is feeling, but in general we have reassured her there is no evidence of abscess, tumor, infection in her neck.  Final Clinical Impression(s) / ED Diagnoses Final diagnoses:  Severe headache    Rx / DC Orders ED Discharge Orders         Ordered    ibuprofen (ADVIL) 400 MG tablet  4 times daily        07/22/20 2159    acetaminophen (TYLENOL 8 HOUR) 650 MG CR tablet  Every 8 hours        07/22/20  2200           Varney Biles, MD 07/23/20 1648

## 2020-10-05 ENCOUNTER — Ambulatory Visit: Payer: BLUE CROSS/BLUE SHIELD | Admitting: Diagnostic Neuroimaging

## 2021-03-25 IMAGING — CT CT NECK W/ CM
4 series · 14 of 33 positions shown, 17 images · IV contrast (Omnipaque)
Comparison: Concurrently performed noncontrast head CT 07/22/2020.
Head CT 03/11/2018. Brain MRI 04/14/2014.

CLINICAL DATA: Parotid region mass; left submandibular mass and
right ear tinnitus and clear liquid drainage. Additional history
provided: Patient reports ongoing headaches since [REDACTED],
inflammation in neck previously treated with antibiotics, previously
head medication for hypertension.

EXAM:
CT NECK WITH CONTRAST
TECHNIQUE: Multidetector CT imaging of the neck was performed using the
standard protocol following the bolus administration of intravenous
contrast.
CONTRAST:  75mL OMNIPAQUE IOHEXOL 300 MG/ML  SOLN

[Series 2: axial neck · axial · 0.54mm/px · z∈[+690,+842]mm · 5 of 115 slices shown, 7 images]
[im 20/115  soft-tissue]
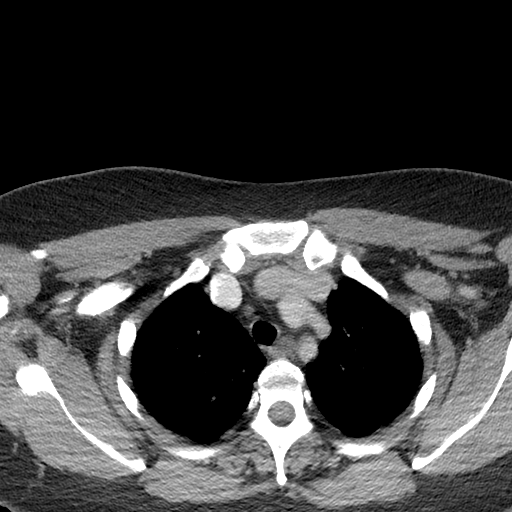
[im 20/115  bone]
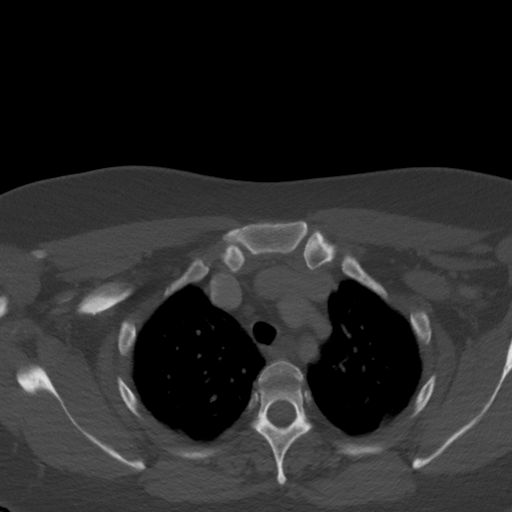
[im 39/115  bone]
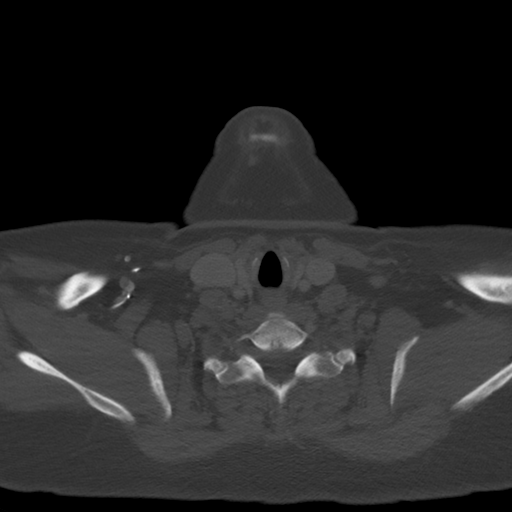
[im 58/115  bone]
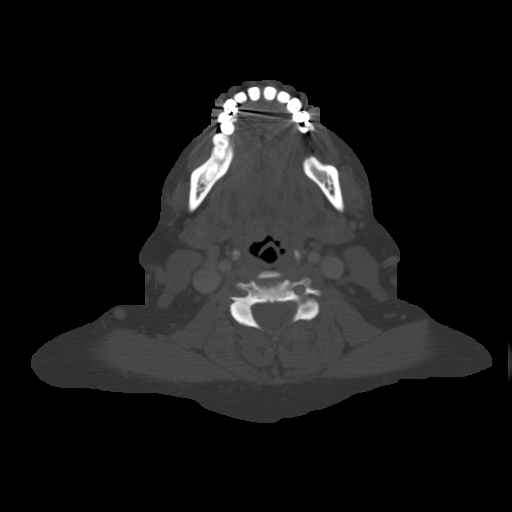
[im 77/115  bone]
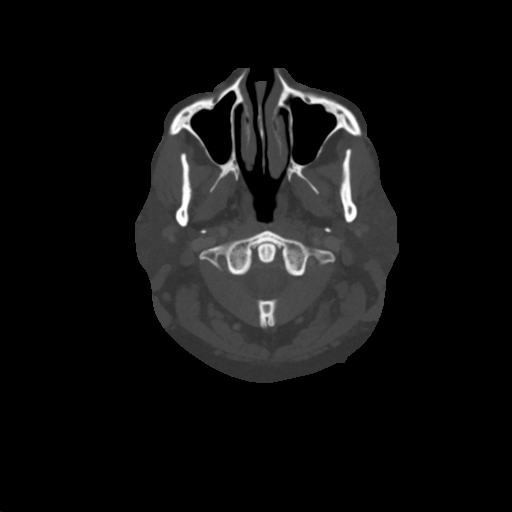
[im 96/115  soft-tissue]
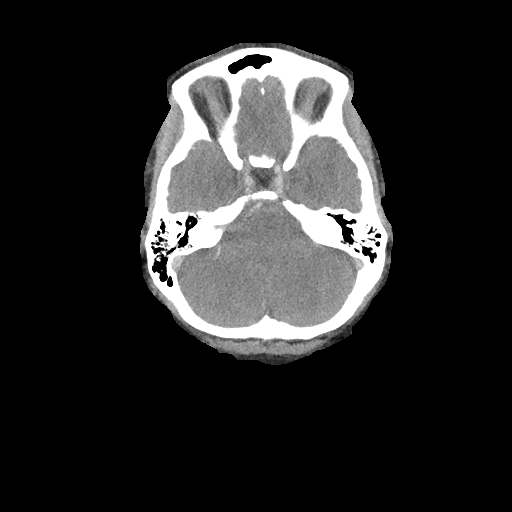
[im 96/115  bone]
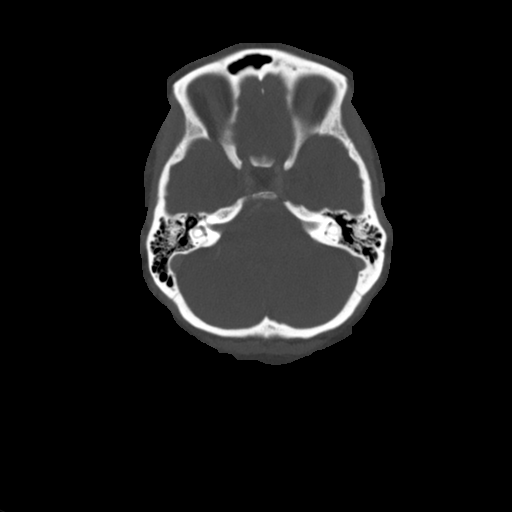

[Series 5: sag neck · sagittal · 0.48mm/px · 5 of 102 slices shown, 6 images]
[im 34/102  bone]
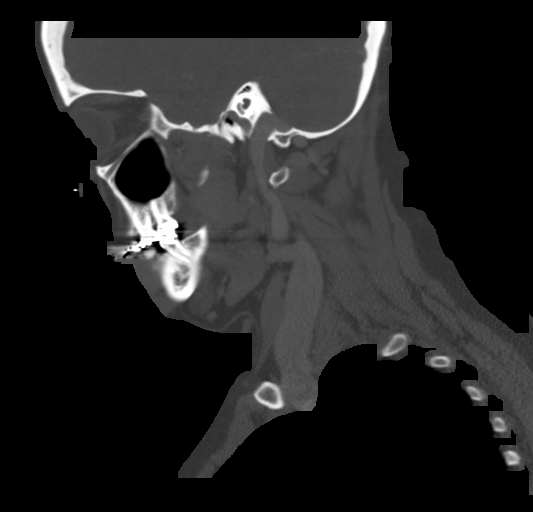
[im 43/102  bone]
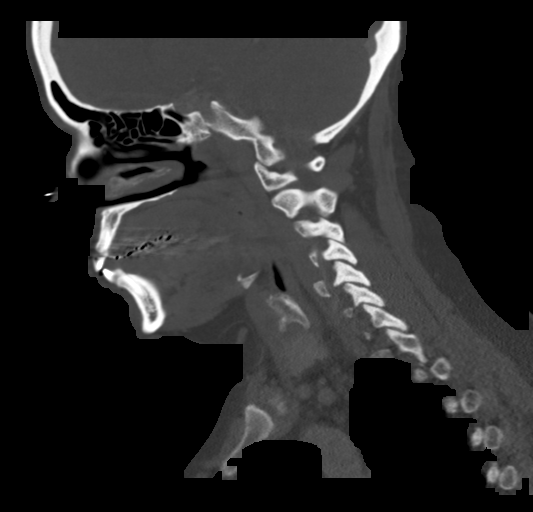
[im 51/102  soft-tissue]
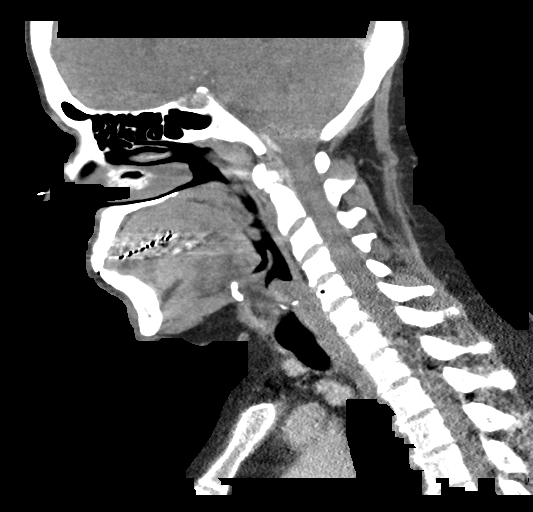
[im 51/102  bone]
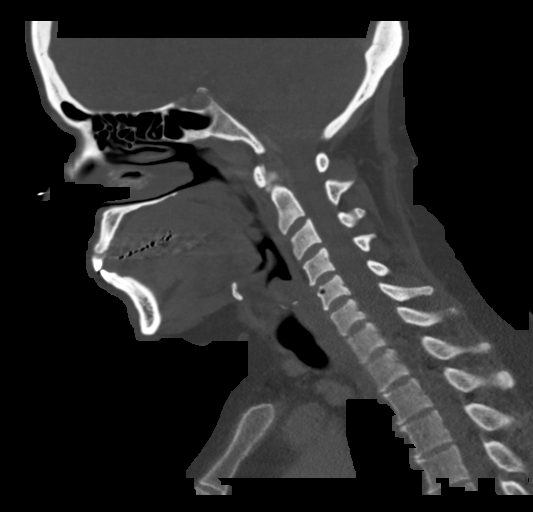
[im 59/102  bone]
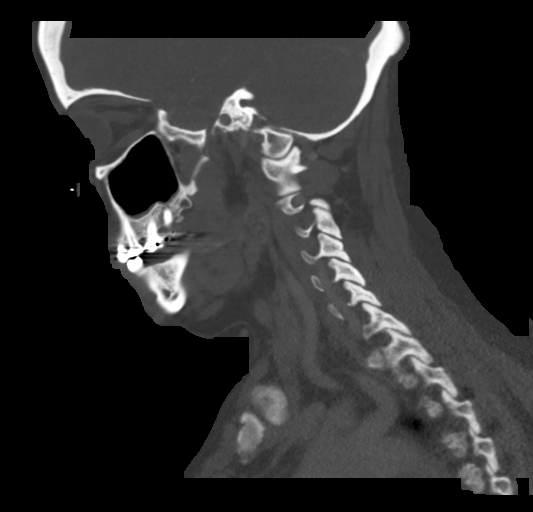
[im 68/102  bone]
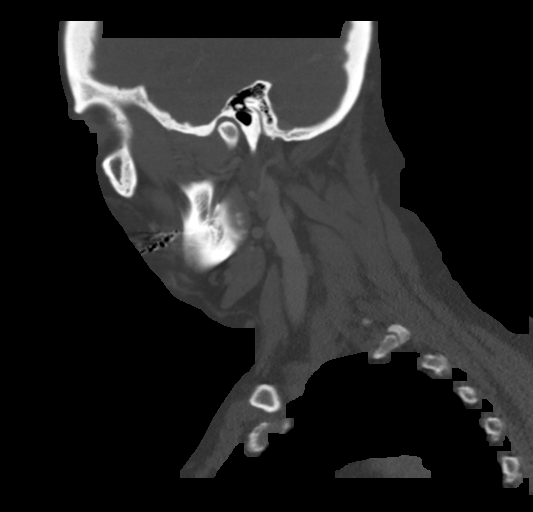

[Series 6: cor neck · coronal · 0.43mm/px · 3 of 145 slices shown]
[im 29/145  bone]
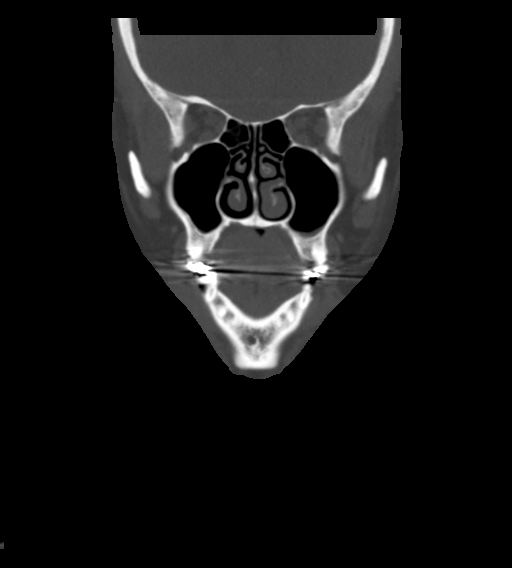
[im 58/145  bone]
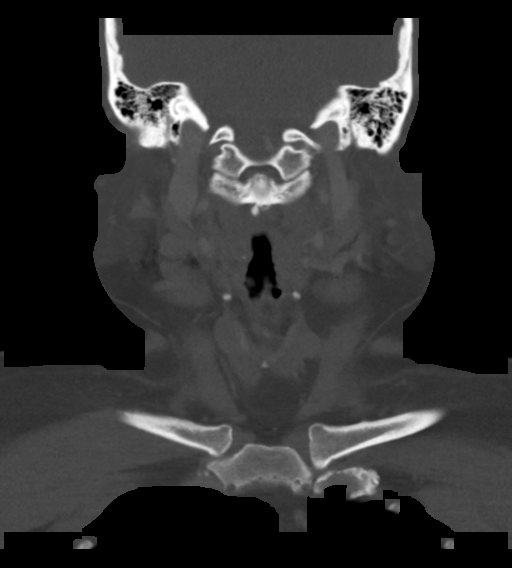
[im 87/145  bone]
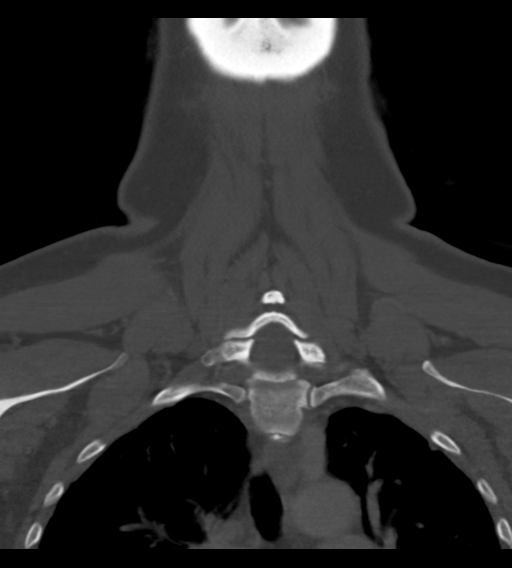

[Series 7: orthogonal ax · axial · 0.43mm/px · 1 of 108 slices shown]
[im 18/108  bone]
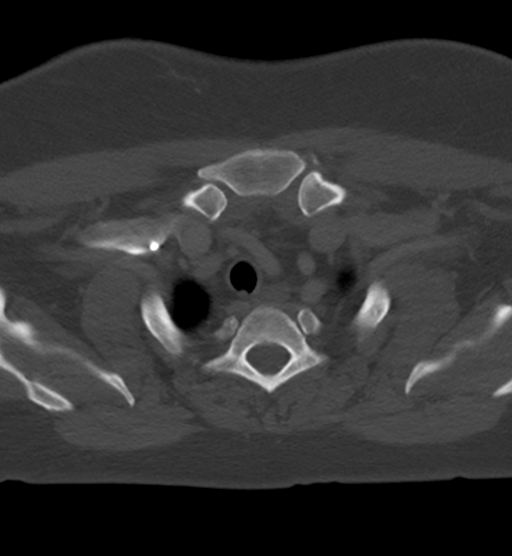

[14 of 33 positions shown; findings below may reference images not displayed]

FINDINGS: Pharynx and larynx: Streak artifact from dental restoration
partially obscures the oral cavity. No appreciable swelling or
discrete mass within the oral cavity, pharynx or larynx.
Postinflammatory calcifications within the palatine tonsils.

Salivary glands: Small nodules within the bilateral parotid glands
likely reflect nonenlarged lymph nodes. The bilateral parotid and
submandibular glands are otherwise unremarkable. Mild nonspecific
prominence of the right sublingual gland without appreciable mass
(for instance as seen on series 6, image 44).

Thyroid: Unremarkable

Lymph nodes: No pathologically enlarged cervical chain lymph nodes.

Vascular: The major vascular structures of the neck are patent.

Limited intracranial: No acute finding at the imaged levels.
Partially empty sella turcica.

Visualized orbits: No mass or acute finding.

Mastoids and visualized paranasal sinuses: Small right maxillary
sinus mucous retention cyst. No appreciable middle ear or mastoid
effusion.

Skeleton: No acute bony abnormality or aggressive osseous lesion.

Upper chest: No consolidation within the imaged lung apices.
IMPRESSION: Mild nonspecific prominence of the right sublingual gland without
appreciable mass or surrounding inflammation.

Small nodules within the parotid glands likely reflect nonenlarged
lymph nodes. The parotid and submandibular glands are otherwise
unremarkable.

No pathologically enlarged cervical chain lymph nodes.

No appreciable middle ear or mastoid effusion.

Small right maxillary sinus mucous retention cyst.

Partially empty sella turcica. This finding is very commonly
incidental, but can be associated with idiopathic intracranial
hypertension.

## 2021-03-25 IMAGING — CT CT HEAD W/O CM
3 series · 16 of 47 positions shown, 19 images · non-contrast
Comparison: 02/09/2018

CLINICAL DATA: Headache.

EXAM:
CT HEAD WITHOUT CONTRAST
TECHNIQUE: Contiguous axial images were obtained from the base of the skull
through the vertex without intravenous contrast.

[Series 2: head wo · axial · 0.43mm/px · z∈[+827,+957]mm · 10 of 32 slices shown, 13 images]
[im 3/32  brain]
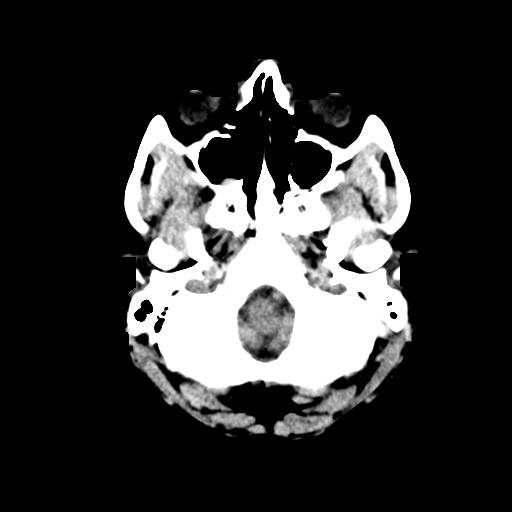
[im 3/32  bone]
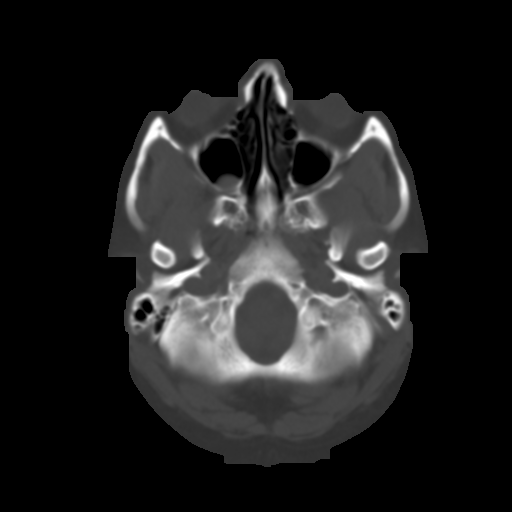
[im 6/32  brain]
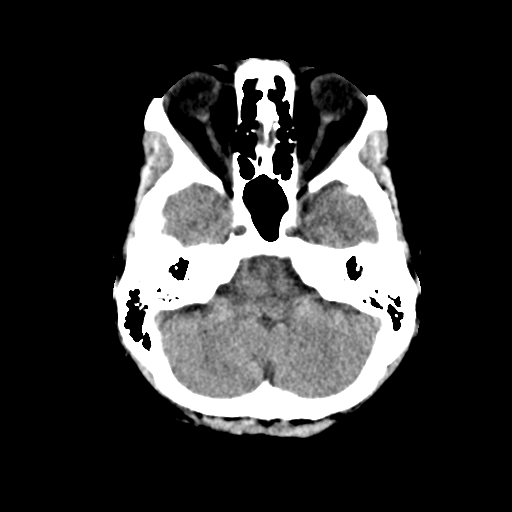
[im 9/32  brain]
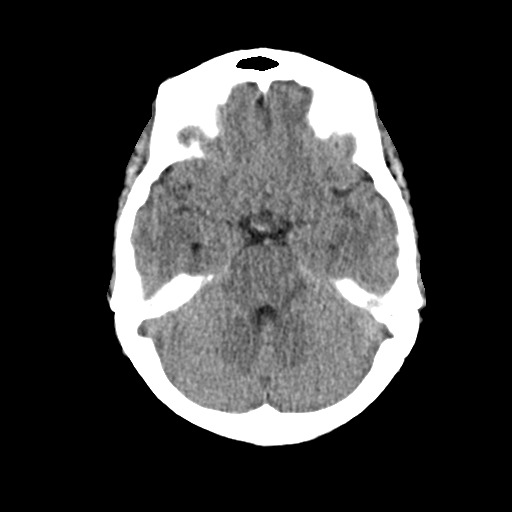
[im 11/32  brain]
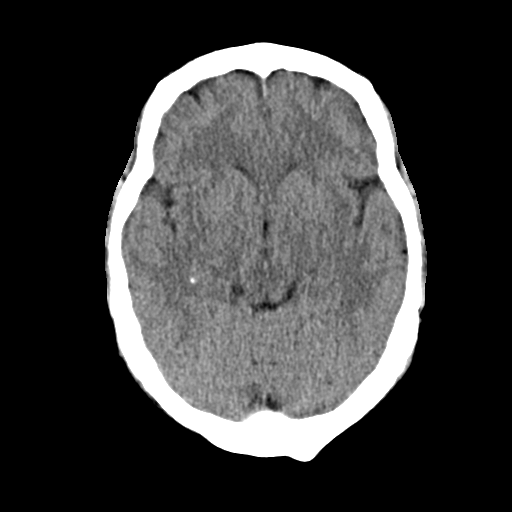
[im 14/32  brain]
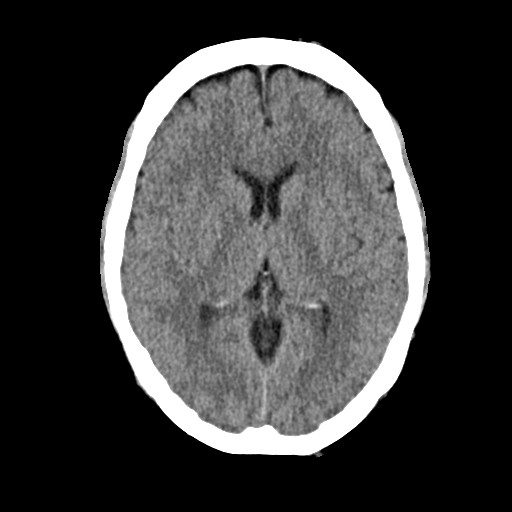
[im 14/32  bone]
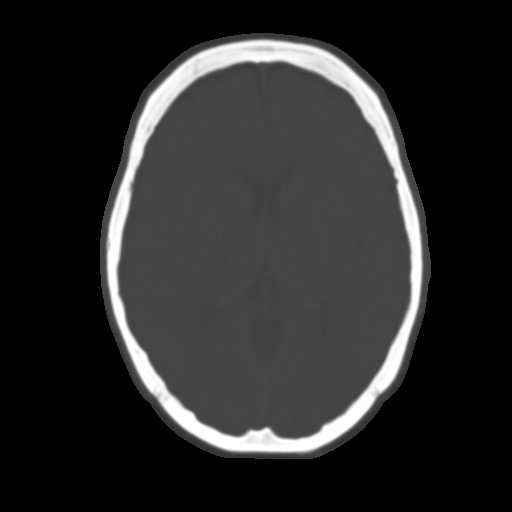
[im 18/32  brain]
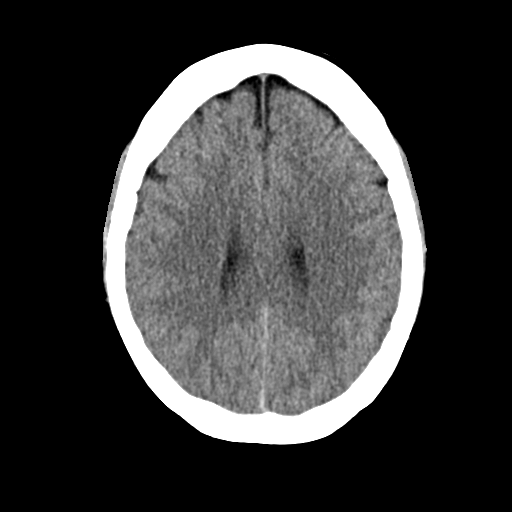
[im 21/32  brain]
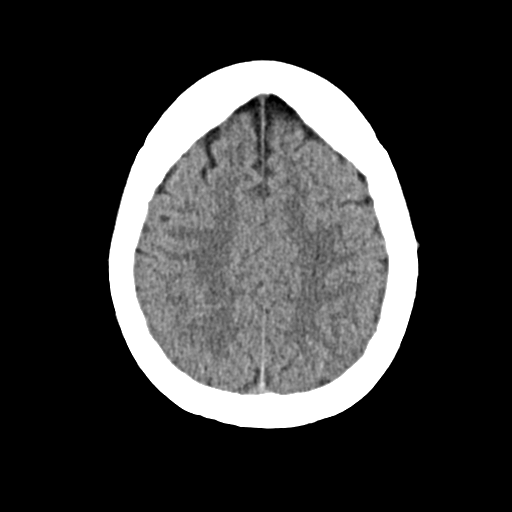
[im 24/32  brain]
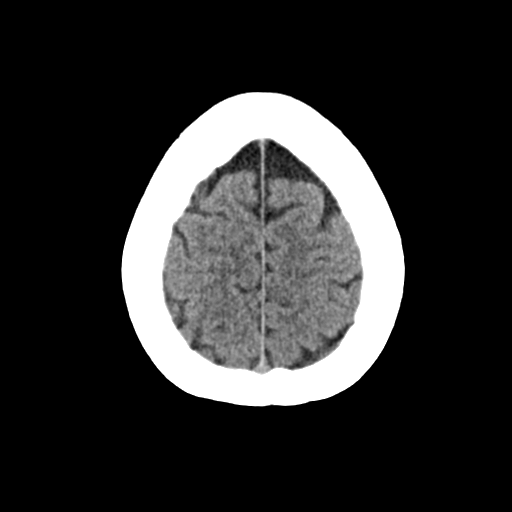
[im 26/32  brain]
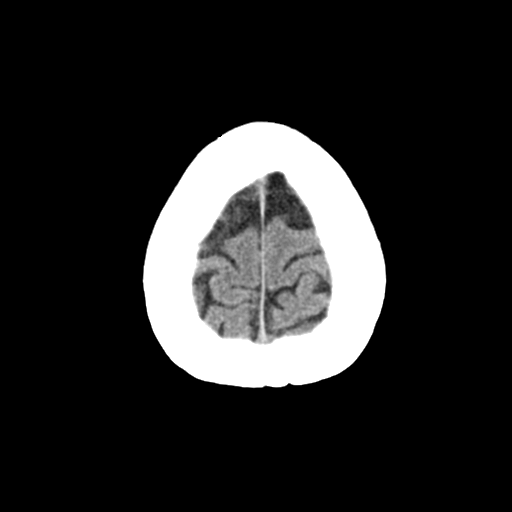
[im 26/32  bone]
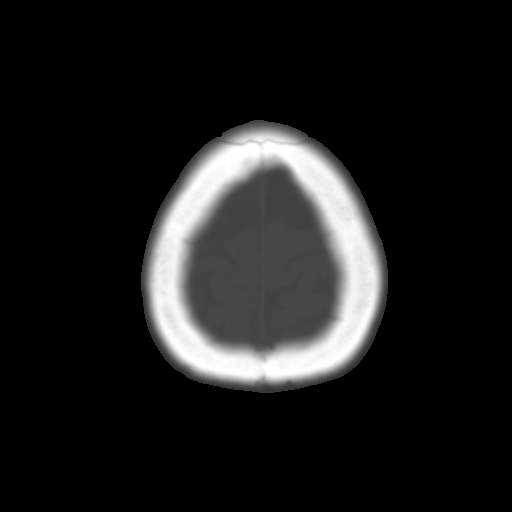
[im 29/32  brain]
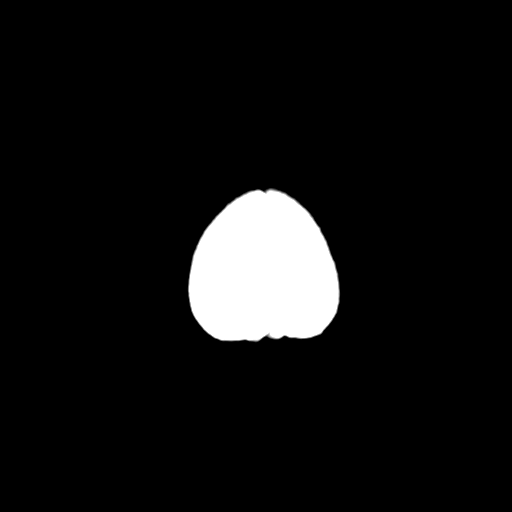

[Series 4: coronal soft · coronal · 0.32mm/px · 3 of 68 slices shown]
[im 23/68  brain]
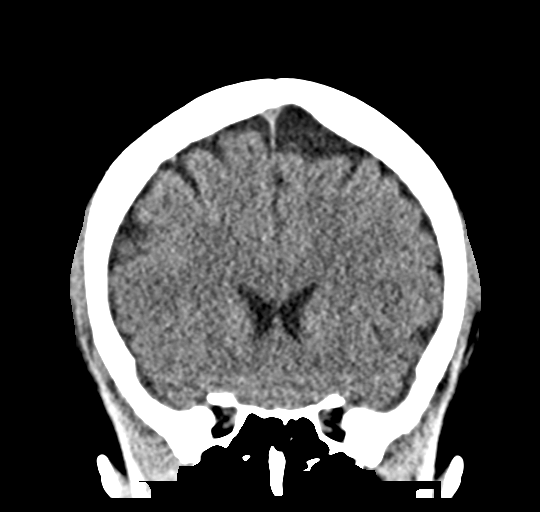
[im 30/68  brain]
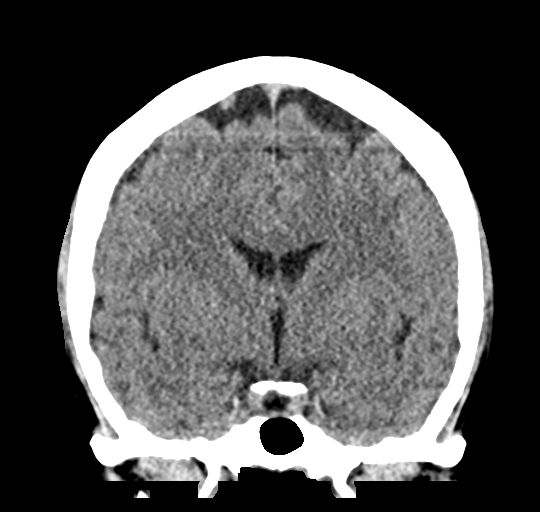
[im 38/68  brain]
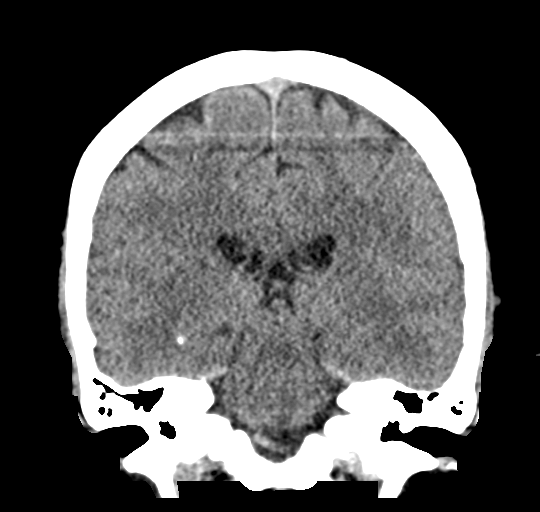

[Series 5: sag soft · sagittal · 0.33mm/px · 3 of 61 slices shown]
[im 21/61  brain]
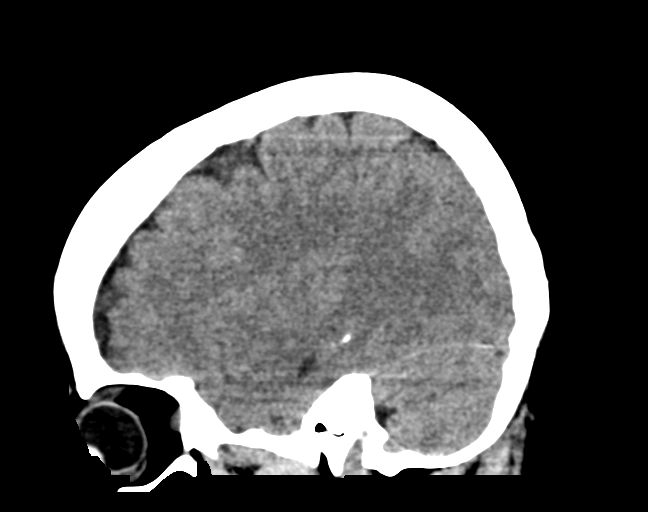
[im 31/61  brain]
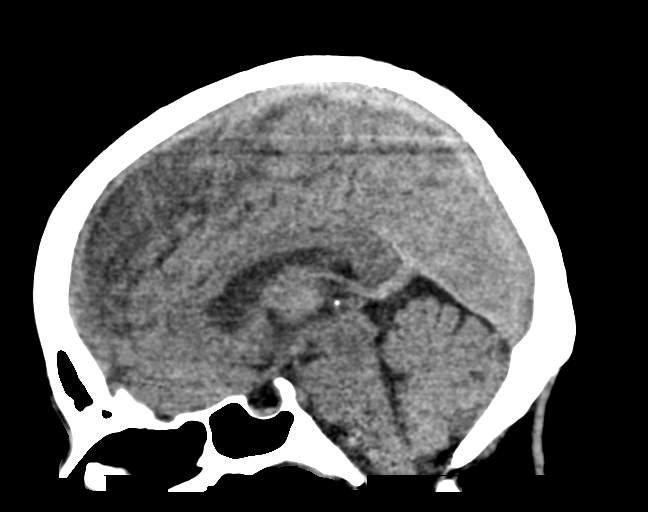
[im 41/61  brain]
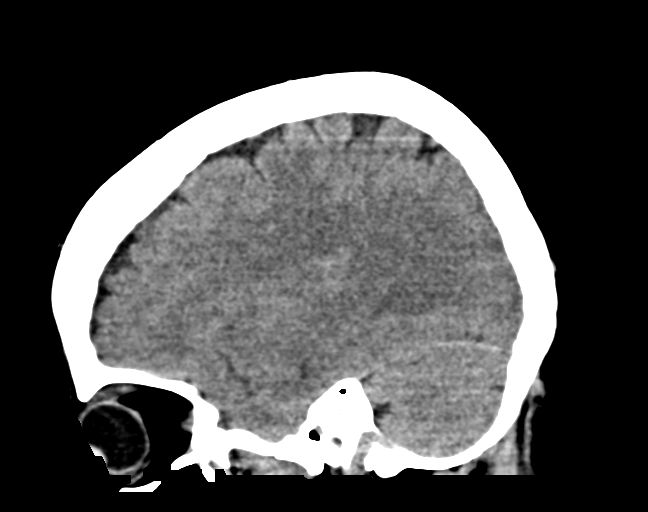

[16 of 47 positions shown; findings below may reference images not displayed]

FINDINGS: Brain: No evidence of acute infarction, hemorrhage, hydrocephalus,
extra-axial collection or mass lesion/mass effect.

Vascular: No hyperdense vessel or unexpected calcification.

Skull: Normal. Negative for fracture or focal lesion.

Sinuses/Orbits: Globes and orbits are unremarkable. Small mucous
retention cyst in the posterior right maxillary sinus. Sinuses
otherwise clear.

Other: None.
IMPRESSION: 1. No intracranial abnormality.
